# Patient Record
Sex: Female | Born: 1955
Health system: Southern US, Community
[De-identification: ages and names within clinical notes are randomized; demographics above are authoritative.]

---

## 1999-06-04 ENCOUNTER — Other Ambulatory Visit: Admission: RE | Admit: 1999-06-04 | Discharge: 1999-06-04 | Payer: Self-pay | Admitting: Obstetrics and Gynecology

## 2000-06-11 ENCOUNTER — Other Ambulatory Visit: Admission: RE | Admit: 2000-06-11 | Discharge: 2000-06-11 | Payer: Self-pay | Admitting: Obstetrics and Gynecology

## 2000-07-02 ENCOUNTER — Encounter: Admission: RE | Admit: 2000-07-02 | Discharge: 2000-07-02 | Payer: Self-pay | Admitting: Family Medicine

## 2000-07-02 ENCOUNTER — Encounter: Payer: Self-pay | Admitting: Family Medicine

## 2001-07-21 ENCOUNTER — Other Ambulatory Visit: Admission: RE | Admit: 2001-07-21 | Discharge: 2001-07-21 | Payer: Self-pay | Admitting: Obstetrics and Gynecology

## 2001-10-12 ENCOUNTER — Encounter: Admission: RE | Admit: 2001-10-12 | Discharge: 2002-01-10 | Payer: Self-pay | Admitting: Family Medicine

## 2002-08-28 ENCOUNTER — Encounter: Payer: Self-pay | Admitting: Family Medicine

## 2002-08-28 ENCOUNTER — Encounter: Admission: RE | Admit: 2002-08-28 | Discharge: 2002-08-28 | Payer: Self-pay | Admitting: Family Medicine

## 2003-11-28 ENCOUNTER — Other Ambulatory Visit: Admission: RE | Admit: 2003-11-28 | Discharge: 2003-11-28 | Payer: Self-pay | Admitting: Obstetrics and Gynecology

## 2004-01-07 ENCOUNTER — Other Ambulatory Visit: Admission: RE | Admit: 2004-01-07 | Discharge: 2004-01-07 | Payer: Self-pay | Admitting: Obstetrics and Gynecology

## 2004-01-09 ENCOUNTER — Encounter: Admission: RE | Admit: 2004-01-09 | Discharge: 2004-01-09 | Payer: Self-pay | Admitting: Family Medicine

## 2004-01-30 ENCOUNTER — Encounter: Admission: RE | Admit: 2004-01-30 | Discharge: 2004-01-30 | Payer: Self-pay | Admitting: Family Medicine

## 2004-04-16 ENCOUNTER — Encounter (INDEPENDENT_AMBULATORY_CARE_PROVIDER_SITE_OTHER): Payer: Self-pay | Admitting: *Deleted

## 2004-04-16 ENCOUNTER — Ambulatory Visit (HOSPITAL_COMMUNITY): Admission: RE | Admit: 2004-04-16 | Discharge: 2004-04-16 | Payer: Self-pay | Admitting: Endocrinology

## 2005-02-02 ENCOUNTER — Other Ambulatory Visit: Admission: RE | Admit: 2005-02-02 | Discharge: 2005-02-02 | Payer: Self-pay | Admitting: Obstetrics and Gynecology

## 2005-02-12 ENCOUNTER — Encounter: Admission: RE | Admit: 2005-02-12 | Discharge: 2005-02-12 | Payer: Self-pay | Admitting: Endocrinology

## 2006-04-15 ENCOUNTER — Other Ambulatory Visit: Admission: RE | Admit: 2006-04-15 | Discharge: 2006-04-15 | Payer: Self-pay | Admitting: Obstetrics and Gynecology

## 2006-08-12 ENCOUNTER — Encounter: Admission: RE | Admit: 2006-08-12 | Discharge: 2006-08-12 | Payer: Self-pay | Admitting: Family Medicine

## 2007-07-27 ENCOUNTER — Other Ambulatory Visit: Admission: RE | Admit: 2007-07-27 | Discharge: 2007-07-27 | Payer: Self-pay | Admitting: Obstetrics and Gynecology

## 2008-02-14 ENCOUNTER — Other Ambulatory Visit: Admission: RE | Admit: 2008-02-14 | Discharge: 2008-02-14 | Payer: Self-pay | Admitting: Obstetrics and Gynecology

## 2008-02-14 ENCOUNTER — Encounter: Payer: Self-pay | Admitting: Obstetrics and Gynecology

## 2008-02-14 ENCOUNTER — Ambulatory Visit: Payer: Self-pay | Admitting: Obstetrics and Gynecology

## 2008-06-04 ENCOUNTER — Encounter: Admission: RE | Admit: 2008-06-04 | Discharge: 2008-06-04 | Payer: Self-pay | Admitting: Internal Medicine

## 2008-06-26 ENCOUNTER — Encounter: Admission: RE | Admit: 2008-06-26 | Discharge: 2008-06-26 | Payer: Self-pay | Admitting: Internal Medicine

## 2008-06-26 ENCOUNTER — Encounter (INDEPENDENT_AMBULATORY_CARE_PROVIDER_SITE_OTHER): Payer: Self-pay | Admitting: Interventional Radiology

## 2008-06-26 ENCOUNTER — Other Ambulatory Visit: Admission: RE | Admit: 2008-06-26 | Discharge: 2008-06-26 | Payer: Self-pay | Admitting: Interventional Radiology

## 2008-07-09 ENCOUNTER — Encounter (HOSPITAL_COMMUNITY): Admission: RE | Admit: 2008-07-09 | Discharge: 2008-10-07 | Payer: Self-pay | Admitting: Internal Medicine

## 2008-11-27 ENCOUNTER — Ambulatory Visit: Payer: Self-pay | Admitting: Obstetrics and Gynecology

## 2008-11-27 ENCOUNTER — Other Ambulatory Visit: Admission: RE | Admit: 2008-11-27 | Discharge: 2008-11-27 | Payer: Self-pay | Admitting: Obstetrics and Gynecology

## 2008-11-27 ENCOUNTER — Encounter: Payer: Self-pay | Admitting: Obstetrics and Gynecology

## 2009-06-18 ENCOUNTER — Encounter: Admission: RE | Admit: 2009-06-18 | Discharge: 2009-06-18 | Payer: Self-pay | Admitting: Internal Medicine

## 2010-08-19 ENCOUNTER — Other Ambulatory Visit: Payer: Self-pay | Admitting: Internal Medicine

## 2010-08-19 DIAGNOSIS — E042 Nontoxic multinodular goiter: Secondary | ICD-10-CM

## 2010-08-22 ENCOUNTER — Ambulatory Visit
Admission: RE | Admit: 2010-08-22 | Discharge: 2010-08-22 | Disposition: A | Discharge status: Home / Self Care | Payer: 59 | Source: Ambulatory Visit | Attending: Internal Medicine | Admitting: Internal Medicine

## 2010-08-22 DIAGNOSIS — E042 Nontoxic multinodular goiter: Secondary | ICD-10-CM

## 2011-12-22 ENCOUNTER — Other Ambulatory Visit: Payer: Self-pay | Admitting: Internal Medicine

## 2011-12-22 DIAGNOSIS — E049 Nontoxic goiter, unspecified: Secondary | ICD-10-CM

## 2012-01-05 ENCOUNTER — Other Ambulatory Visit: Payer: 59

## 2012-01-07 ENCOUNTER — Ambulatory Visit
Admission: RE | Admit: 2012-01-07 | Discharge: 2012-01-07 | Disposition: A | Discharge status: Home / Self Care | Payer: Managed Care, Other (non HMO) | Source: Ambulatory Visit | Attending: Internal Medicine | Admitting: Internal Medicine

## 2012-01-07 DIAGNOSIS — E049 Nontoxic goiter, unspecified: Secondary | ICD-10-CM

## 2013-08-02 ENCOUNTER — Other Ambulatory Visit: Payer: Self-pay | Admitting: Internal Medicine

## 2013-08-02 DIAGNOSIS — E049 Nontoxic goiter, unspecified: Secondary | ICD-10-CM

## 2013-08-09 ENCOUNTER — Other Ambulatory Visit: Payer: Managed Care, Other (non HMO)

## 2013-08-28 ENCOUNTER — Ambulatory Visit
Admission: RE | Admit: 2013-08-28 | Discharge: 2013-08-28 | Disposition: A | Discharge status: Home / Self Care | Payer: Managed Care, Other (non HMO) | Source: Ambulatory Visit | Attending: Internal Medicine | Admitting: Internal Medicine

## 2013-08-28 DIAGNOSIS — E049 Nontoxic goiter, unspecified: Secondary | ICD-10-CM

## 2013-10-30 ENCOUNTER — Other Ambulatory Visit: Payer: Self-pay | Admitting: Family Medicine

## 2013-10-30 ENCOUNTER — Other Ambulatory Visit (HOSPITAL_COMMUNITY)
Admission: RE | Admit: 2013-10-30 | Discharge: 2013-10-30 | Disposition: A | Discharge status: Home / Self Care | Payer: Managed Care, Other (non HMO) | Source: Ambulatory Visit | Attending: Family Medicine | Admitting: Family Medicine

## 2013-10-30 DIAGNOSIS — Z0000 Encounter for general adult medical examination without abnormal findings: Secondary | ICD-10-CM | POA: Insufficient documentation

## 2013-10-31 LAB — CYTOLOGY - PAP

## 2014-05-29 ENCOUNTER — Other Ambulatory Visit: Payer: Self-pay | Admitting: Family Medicine

## 2014-05-29 DIAGNOSIS — E2839 Other primary ovarian failure: Secondary | ICD-10-CM

## 2014-10-22 IMAGING — US US SOFT TISSUE HEAD/NECK
1 series · 13 of 25 positions shown · non-contrast
Comparison: 01/07/2012 and 08/22/2010

CLINICAL DATA: Follow-up thyroid nodules.

EXAM:
THYROID ULTRASOUND
TECHNIQUE: Ultrasound examination of the thyroid gland and adjacent soft
tissues was performed.

[Series 1: us soft tissue head/neck · 0.10mm/px · 13 of 82 slices shown]
[im 1/82]
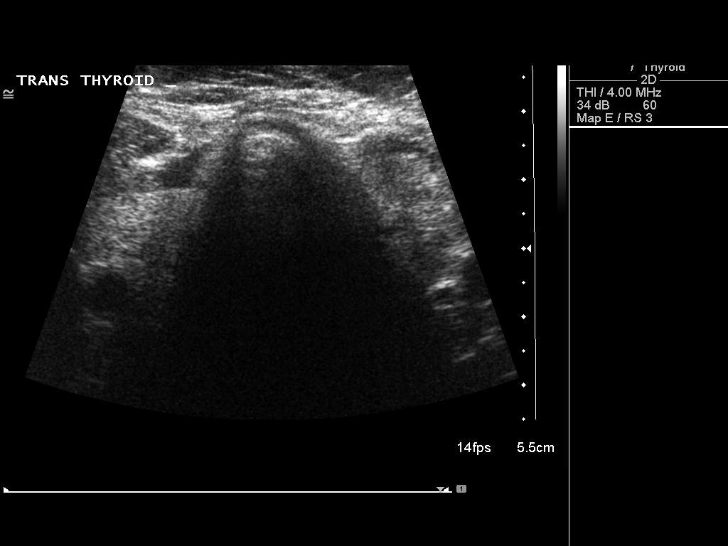
[im 7/82]
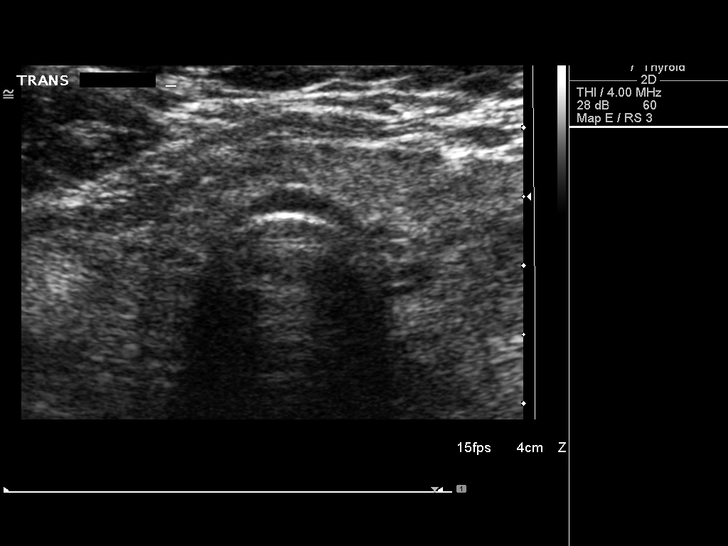
[im 14/82]
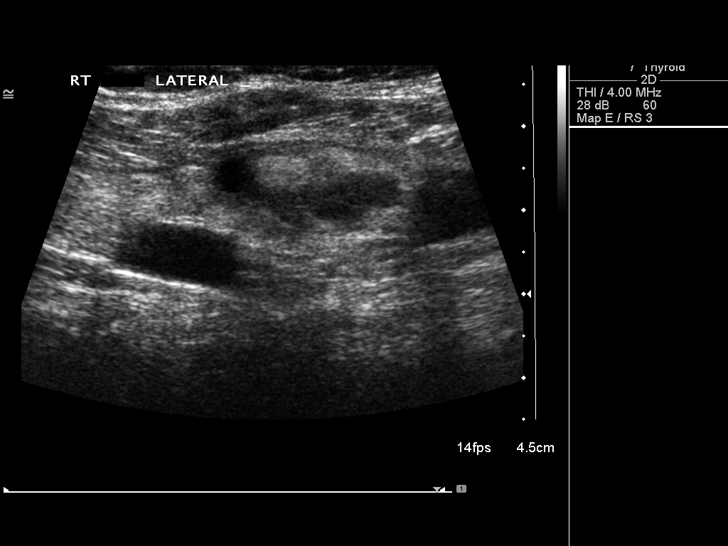
[im 21/82]
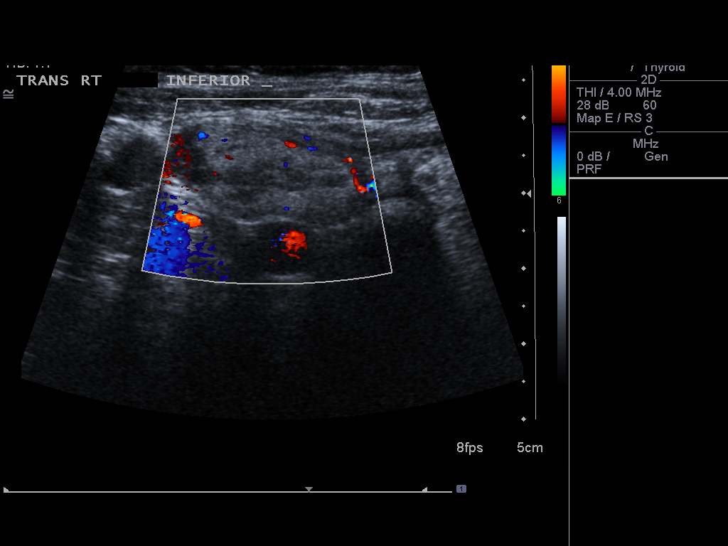
[im 28/82]
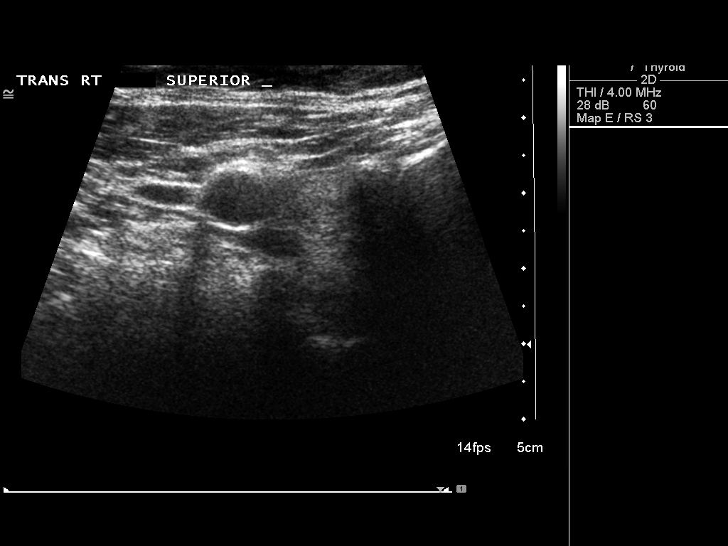
[im 34/82]
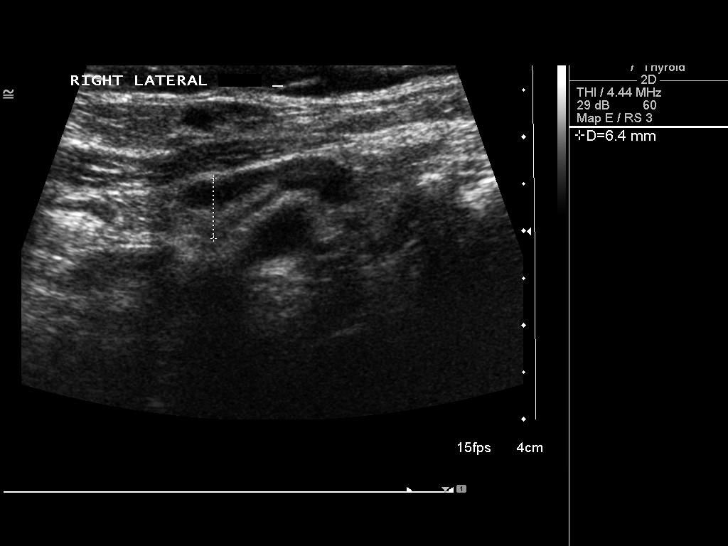
[im 41/82]
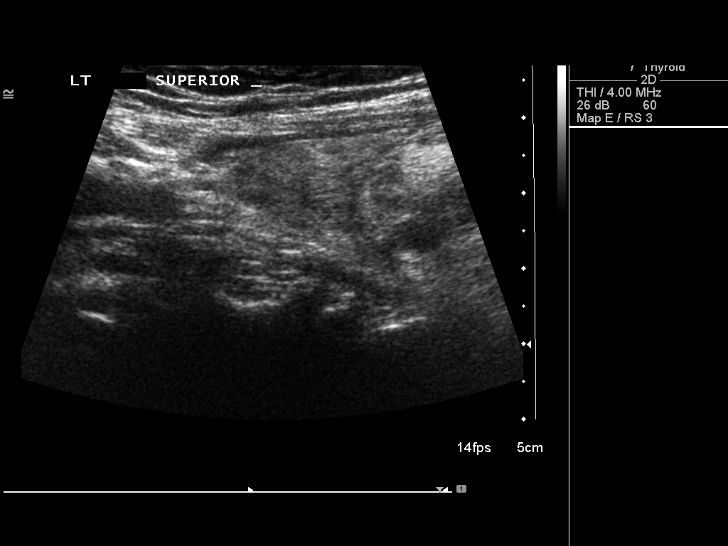
[im 48/82]
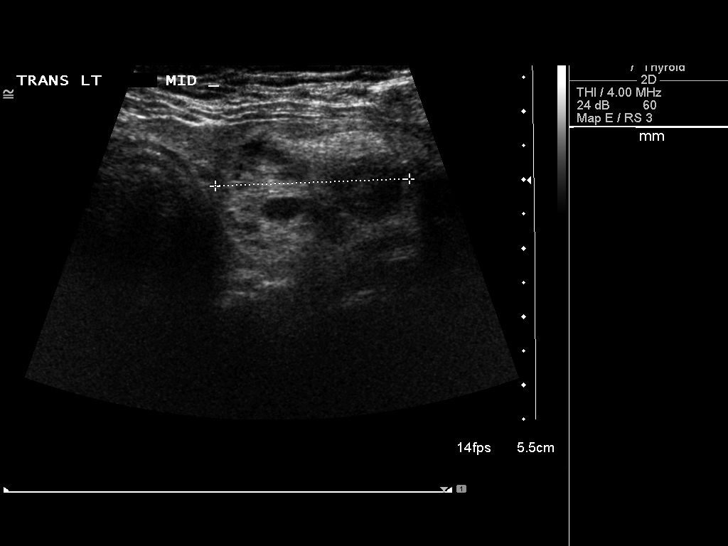
[im 55/82]
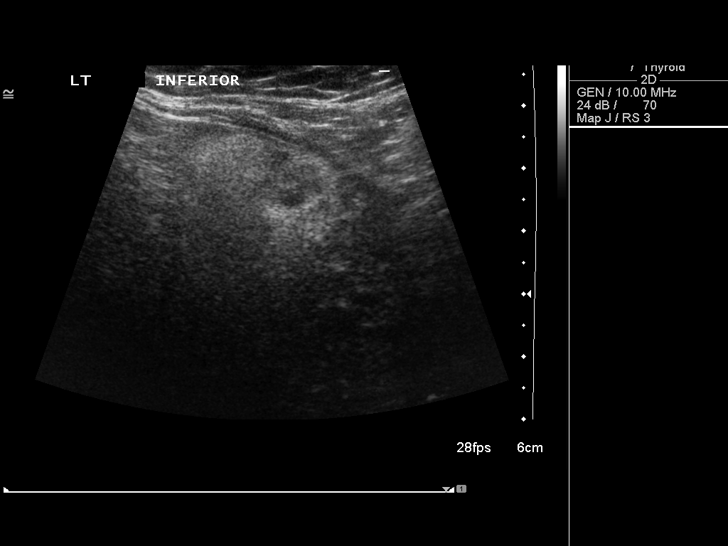
[im 61/82]
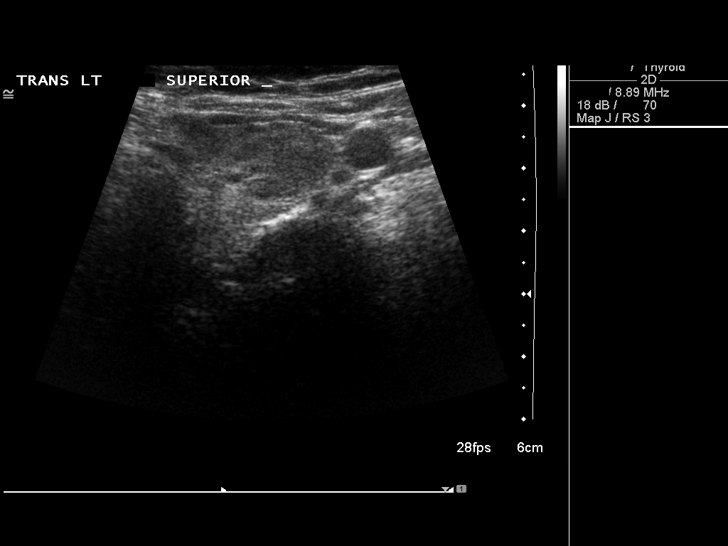
[im 68/82]
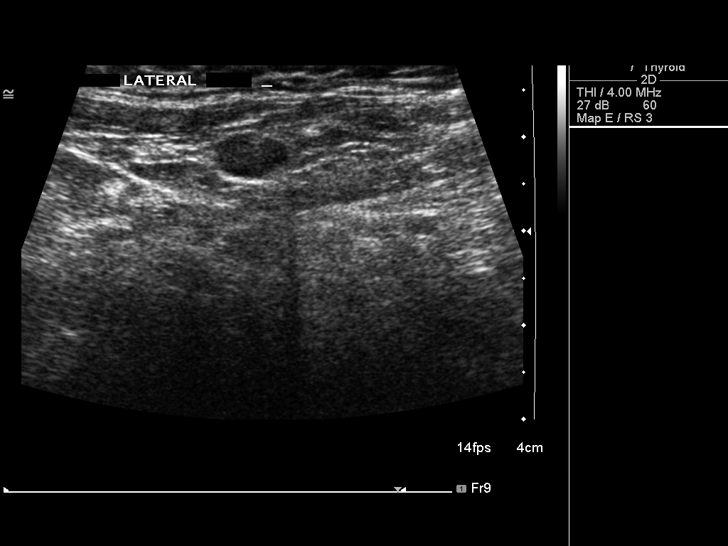
[im 75/82]
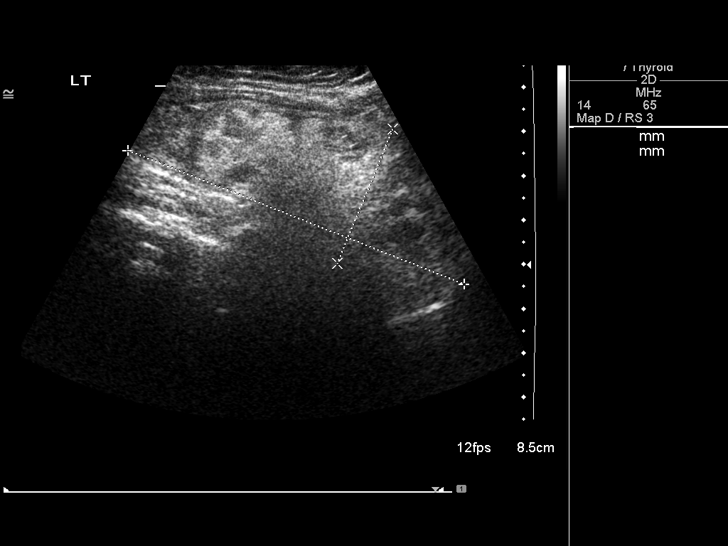
[im 82/82]
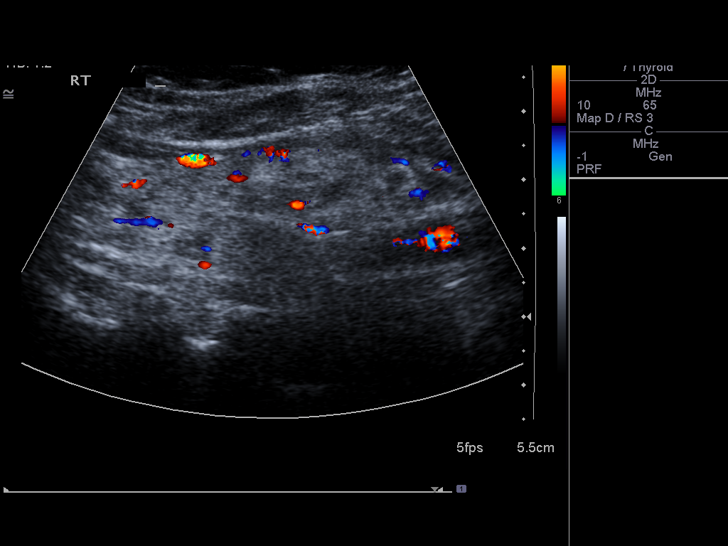

[13 of 25 positions shown; findings below may reference images not displayed]

FINDINGS: Right thyroid lobe

Measurements: 6.4 x 2.7 x 2.4 cm. There is a heterogeneous and
slightly hyperechoic nodule along the inferior right thyroid lobe.
This nodule measures 3.5 x 1.3 x 2.3 cm and previously measured
x 1.4 x 2.9 cm. Hypoechoic nodule along the posterior aspect of the
right thyroid lobe was not measured on the previous examinations but
was probably present on the exam from 08/22/2010. This area roughly
measures 2.1 x 1.1 x 1.3 cm. This lesion is not well defined and
could represent a pseudo-nodule.

Left thyroid lobe

Measurements: 8.2 x 3.3 x 3.2 cm. Heterogeneous nodule in the mid
left thyroid lobe measures 2.8 x 2.1 x 2.8 cm. Nodule previously
measured 3.1 x 2.2 x 2.8 cm. Heterogeneous solid nodule along the
inferior and lateral left lobe measures 1.7 x 1.9 x 1.6 cm. This was
not clearly identified on the prior examination. Dominant nodule in
the inferior left thyroid lobe measures 4.8 x 2.9 x 4.2 cm and
previously measured 4.2 x 3.9 x 4.5 cm.

Isthmus

Thickness: 0.3 cm.  No nodules visualized.

Lymphadenopathy

None visualized.
IMPRESSION: Multi-nodular goiter. The dominant nodules have not significantly
changed. Unclear if there is a new nodule along the lateral left
thyroid lobe which may be associated with the adjacent dominant
nodule. There is also a questionable nodule along the posterior
right thyroid lobe.

## 2016-02-28 ENCOUNTER — Other Ambulatory Visit: Payer: Self-pay | Admitting: Family Medicine

## 2016-02-28 DIAGNOSIS — R7989 Other specified abnormal findings of blood chemistry: Secondary | ICD-10-CM

## 2016-02-28 DIAGNOSIS — R945 Abnormal results of liver function studies: Principal | ICD-10-CM

## 2016-03-25 ENCOUNTER — Other Ambulatory Visit: Payer: Managed Care, Other (non HMO)

## 2017-10-01 ENCOUNTER — Other Ambulatory Visit: Payer: Self-pay | Admitting: Internal Medicine

## 2017-10-01 DIAGNOSIS — E042 Nontoxic multinodular goiter: Secondary | ICD-10-CM

## 2018-03-28 ENCOUNTER — Other Ambulatory Visit: Payer: Managed Care, Other (non HMO)

## 2018-12-09 ENCOUNTER — Other Ambulatory Visit: Payer: Self-pay | Admitting: Family Medicine

## 2018-12-09 ENCOUNTER — Other Ambulatory Visit (HOSPITAL_COMMUNITY)
Admission: RE | Admit: 2018-12-09 | Discharge: 2018-12-09 | Disposition: A | Discharge status: Home / Self Care | Payer: Managed Care, Other (non HMO) | Source: Ambulatory Visit | Attending: Family Medicine | Admitting: Family Medicine

## 2018-12-09 DIAGNOSIS — Z124 Encounter for screening for malignant neoplasm of cervix: Secondary | ICD-10-CM | POA: Insufficient documentation

## 2018-12-15 LAB — CYTOLOGY - PAP: Diagnosis: NEGATIVE

## 2019-03-13 ENCOUNTER — Other Ambulatory Visit: Payer: Self-pay | Admitting: Internal Medicine

## 2019-03-13 DIAGNOSIS — E042 Nontoxic multinodular goiter: Secondary | ICD-10-CM

## 2019-03-17 ENCOUNTER — Other Ambulatory Visit: Payer: Managed Care, Other (non HMO)

## 2019-05-17 ENCOUNTER — Other Ambulatory Visit: Payer: Managed Care, Other (non HMO)

## 2019-06-08 DIAGNOSIS — N182 Chronic kidney disease, stage 2 (mild): Secondary | ICD-10-CM | POA: Diagnosis not present

## 2019-06-08 DIAGNOSIS — E1122 Type 2 diabetes mellitus with diabetic chronic kidney disease: Secondary | ICD-10-CM | POA: Diagnosis not present

## 2019-06-08 DIAGNOSIS — E042 Nontoxic multinodular goiter: Secondary | ICD-10-CM | POA: Diagnosis not present

## 2019-06-08 DIAGNOSIS — E669 Obesity, unspecified: Secondary | ICD-10-CM | POA: Diagnosis not present

## 2019-06-14 ENCOUNTER — Other Ambulatory Visit: Payer: Self-pay

## 2019-06-19 ENCOUNTER — Ambulatory Visit
Admission: RE | Admit: 2019-06-19 | Discharge: 2019-06-19 | Disposition: A | Discharge status: Home / Self Care | Payer: BC Managed Care – PPO | Source: Ambulatory Visit | Attending: Internal Medicine | Admitting: Internal Medicine

## 2019-06-19 DIAGNOSIS — E042 Nontoxic multinodular goiter: Secondary | ICD-10-CM | POA: Diagnosis not present

## 2019-07-04 ENCOUNTER — Other Ambulatory Visit: Payer: Self-pay | Admitting: Internal Medicine

## 2019-07-04 DIAGNOSIS — E042 Nontoxic multinodular goiter: Secondary | ICD-10-CM

## 2019-07-18 ENCOUNTER — Ambulatory Visit
Admission: RE | Admit: 2019-07-18 | Discharge: 2019-07-18 | Disposition: A | Discharge status: Home / Self Care | Payer: BC Managed Care – PPO | Source: Ambulatory Visit | Attending: Internal Medicine | Admitting: Internal Medicine

## 2019-07-18 ENCOUNTER — Other Ambulatory Visit (HOSPITAL_COMMUNITY)
Admission: RE | Admit: 2019-07-18 | Discharge: 2019-07-18 | Disposition: A | Discharge status: Home / Self Care | Payer: BC Managed Care – PPO | Source: Ambulatory Visit | Attending: Internal Medicine | Admitting: Internal Medicine

## 2019-07-18 DIAGNOSIS — E041 Nontoxic single thyroid nodule: Secondary | ICD-10-CM | POA: Insufficient documentation

## 2019-07-18 DIAGNOSIS — E042 Nontoxic multinodular goiter: Secondary | ICD-10-CM | POA: Diagnosis not present

## 2019-07-20 LAB — CYTOLOGY - NON PAP

## 2019-09-08 DIAGNOSIS — E039 Hypothyroidism, unspecified: Secondary | ICD-10-CM | POA: Diagnosis not present

## 2019-09-08 DIAGNOSIS — E042 Nontoxic multinodular goiter: Secondary | ICD-10-CM | POA: Diagnosis not present

## 2019-09-08 DIAGNOSIS — Z0000 Encounter for general adult medical examination without abnormal findings: Secondary | ICD-10-CM | POA: Diagnosis not present

## 2019-09-08 DIAGNOSIS — E785 Hyperlipidemia, unspecified: Secondary | ICD-10-CM | POA: Diagnosis not present

## 2019-12-05 DIAGNOSIS — N182 Chronic kidney disease, stage 2 (mild): Secondary | ICD-10-CM | POA: Diagnosis not present

## 2019-12-05 DIAGNOSIS — E1122 Type 2 diabetes mellitus with diabetic chronic kidney disease: Secondary | ICD-10-CM | POA: Diagnosis not present

## 2019-12-05 DIAGNOSIS — E669 Obesity, unspecified: Secondary | ICD-10-CM | POA: Diagnosis not present

## 2019-12-05 DIAGNOSIS — E042 Nontoxic multinodular goiter: Secondary | ICD-10-CM | POA: Diagnosis not present

## 2019-12-11 DIAGNOSIS — E785 Hyperlipidemia, unspecified: Secondary | ICD-10-CM | POA: Diagnosis not present

## 2019-12-11 DIAGNOSIS — E1122 Type 2 diabetes mellitus with diabetic chronic kidney disease: Secondary | ICD-10-CM | POA: Diagnosis not present

## 2019-12-11 DIAGNOSIS — N182 Chronic kidney disease, stage 2 (mild): Secondary | ICD-10-CM | POA: Diagnosis not present

## 2019-12-11 DIAGNOSIS — E042 Nontoxic multinodular goiter: Secondary | ICD-10-CM | POA: Diagnosis not present

## 2020-02-15 DIAGNOSIS — Z1231 Encounter for screening mammogram for malignant neoplasm of breast: Secondary | ICD-10-CM | POA: Diagnosis not present

## 2020-02-15 DIAGNOSIS — Z13820 Encounter for screening for osteoporosis: Secondary | ICD-10-CM | POA: Diagnosis not present

## 2020-02-15 DIAGNOSIS — Z780 Asymptomatic menopausal state: Secondary | ICD-10-CM | POA: Diagnosis not present

## 2020-04-08 DIAGNOSIS — E1122 Type 2 diabetes mellitus with diabetic chronic kidney disease: Secondary | ICD-10-CM | POA: Diagnosis not present

## 2020-04-08 DIAGNOSIS — E669 Obesity, unspecified: Secondary | ICD-10-CM | POA: Diagnosis not present

## 2020-04-08 DIAGNOSIS — Z23 Encounter for immunization: Secondary | ICD-10-CM | POA: Diagnosis not present

## 2020-04-08 DIAGNOSIS — E042 Nontoxic multinodular goiter: Secondary | ICD-10-CM | POA: Diagnosis not present

## 2020-04-08 DIAGNOSIS — N182 Chronic kidney disease, stage 2 (mild): Secondary | ICD-10-CM | POA: Diagnosis not present

## 2020-07-08 DIAGNOSIS — E042 Nontoxic multinodular goiter: Secondary | ICD-10-CM | POA: Diagnosis not present

## 2020-07-08 DIAGNOSIS — E669 Obesity, unspecified: Secondary | ICD-10-CM | POA: Diagnosis not present

## 2020-07-08 DIAGNOSIS — N182 Chronic kidney disease, stage 2 (mild): Secondary | ICD-10-CM | POA: Diagnosis not present

## 2020-07-08 DIAGNOSIS — E1122 Type 2 diabetes mellitus with diabetic chronic kidney disease: Secondary | ICD-10-CM | POA: Diagnosis not present

## 2020-07-18 DIAGNOSIS — E119 Type 2 diabetes mellitus without complications: Secondary | ICD-10-CM | POA: Diagnosis not present

## 2020-08-12 IMAGING — US US THYROID
1 series · 12 of 25 positions shown · non-contrast
Comparison: 08/28/2013 and previous

CLINICAL DATA: Goiter . History of FNA biopsy right nodule
06/26/2008, right lower lobe nodule 04/16/2004.

EXAM:
THYROID ULTRASOUND
TECHNIQUE: Ultrasound examination of the thyroid gland and adjacent soft
tissues was performed.

[Series 1: us thyroid · 0.05mm/px · 12 of 43 slices shown]
[im 2/43]
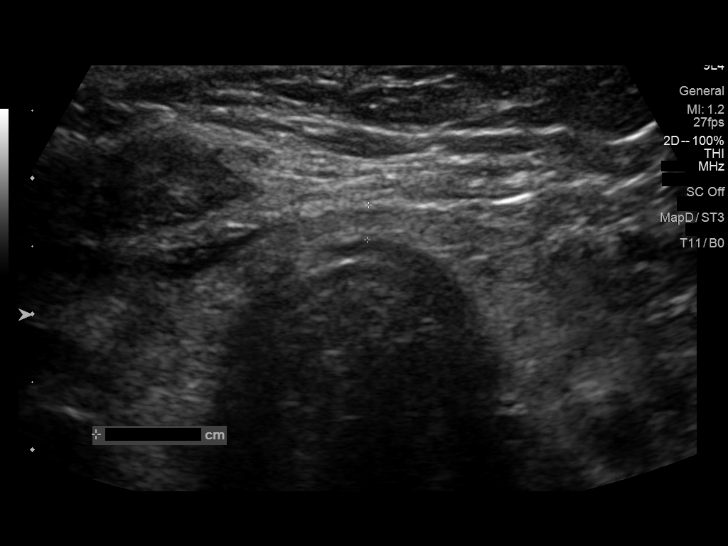
[im 6/43]
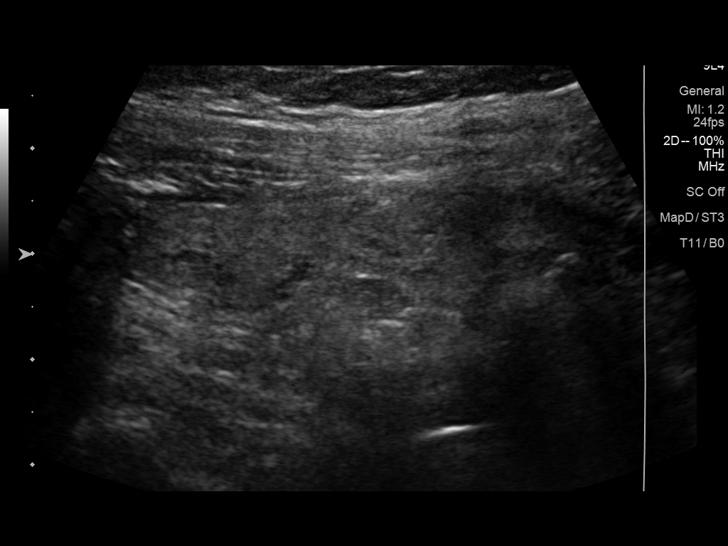
[im 9/43]
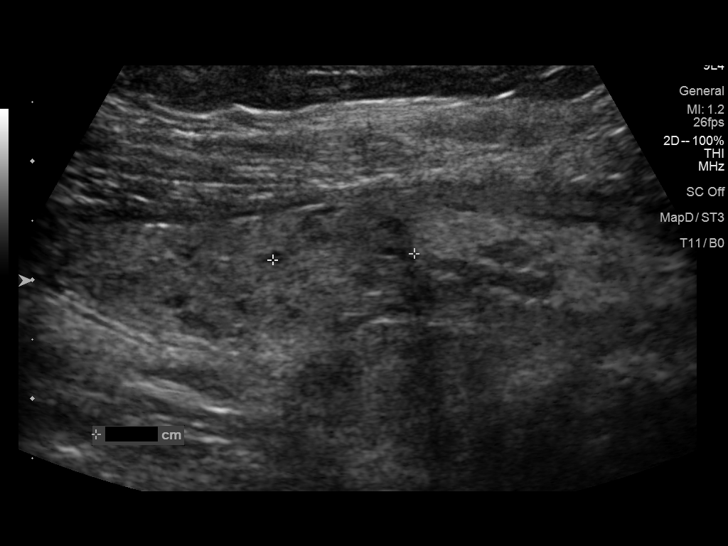
[im 13/43]
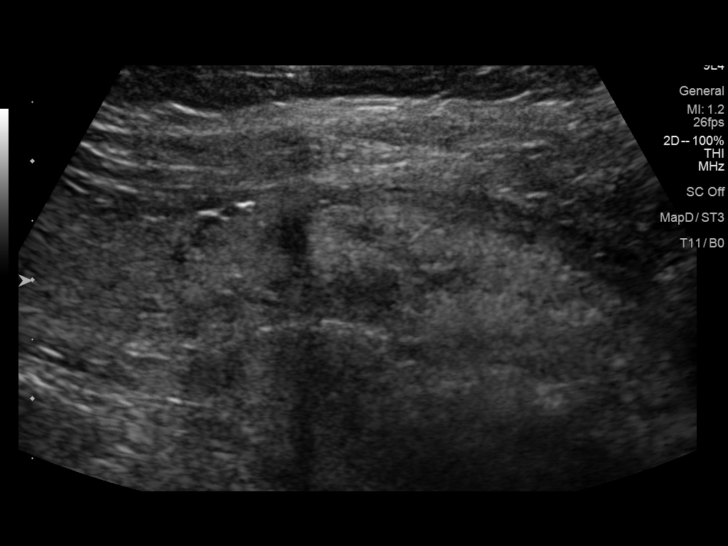
[im 16/43]
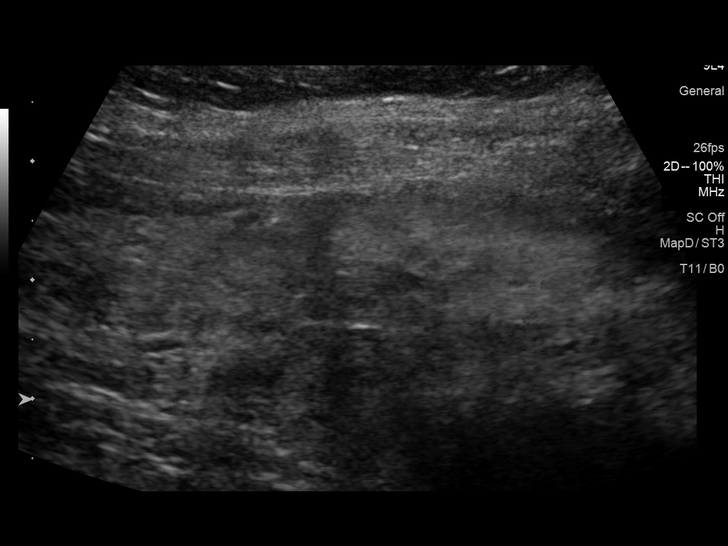
[im 20/43]
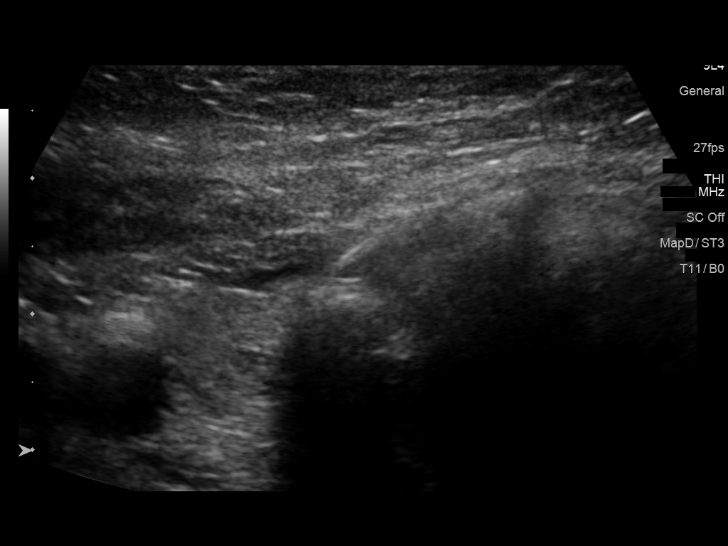
[im 23/43]
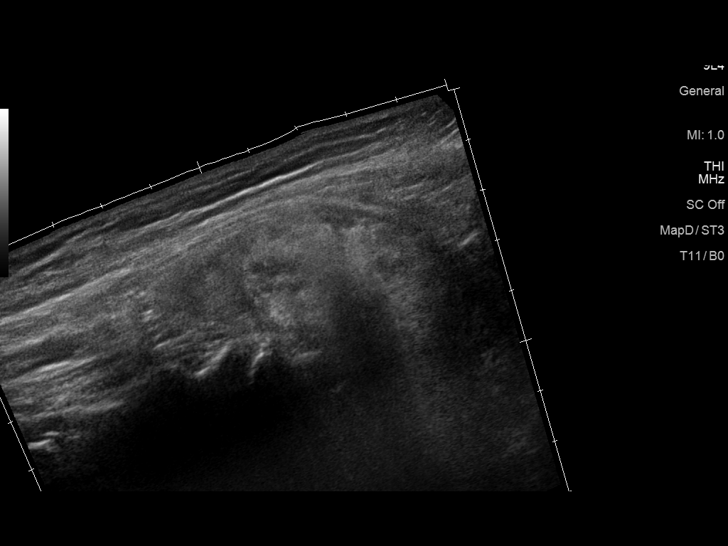
[im 27/43]
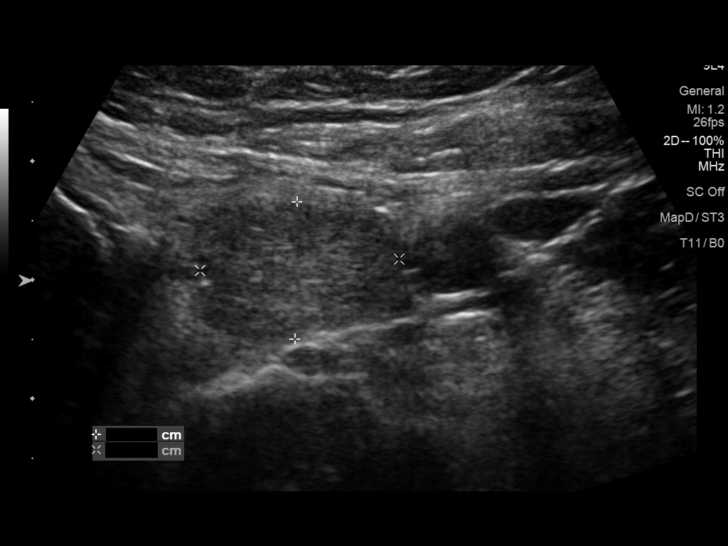
[im 30/43]
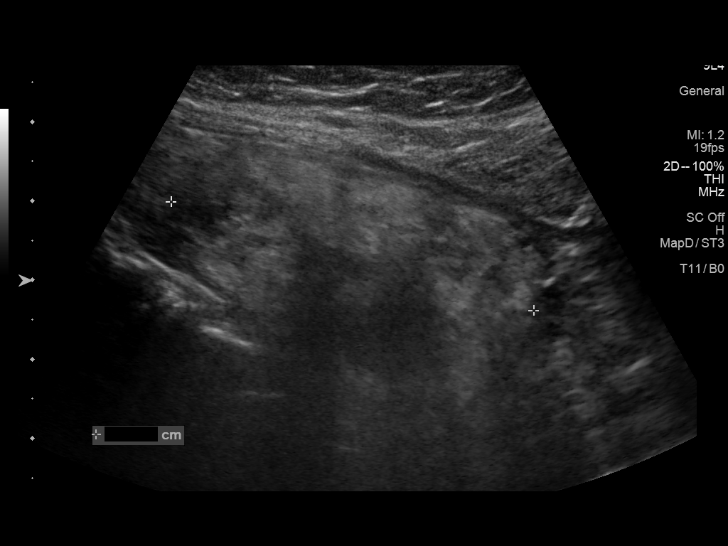
[im 34/43]
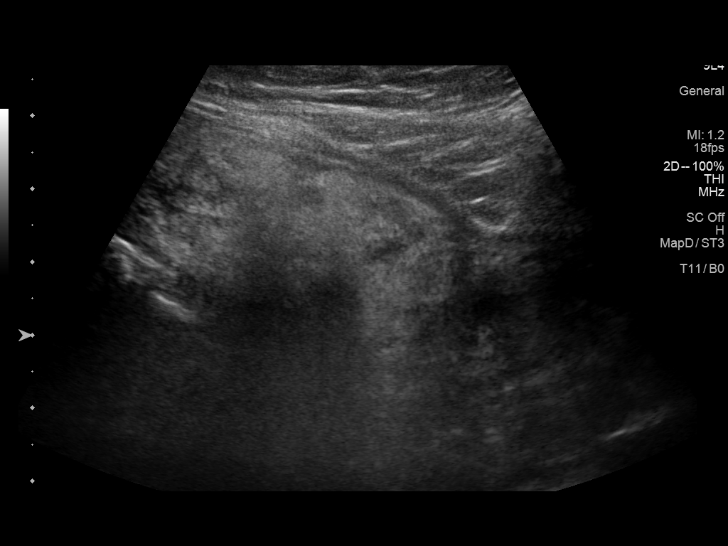
[im 37/43]
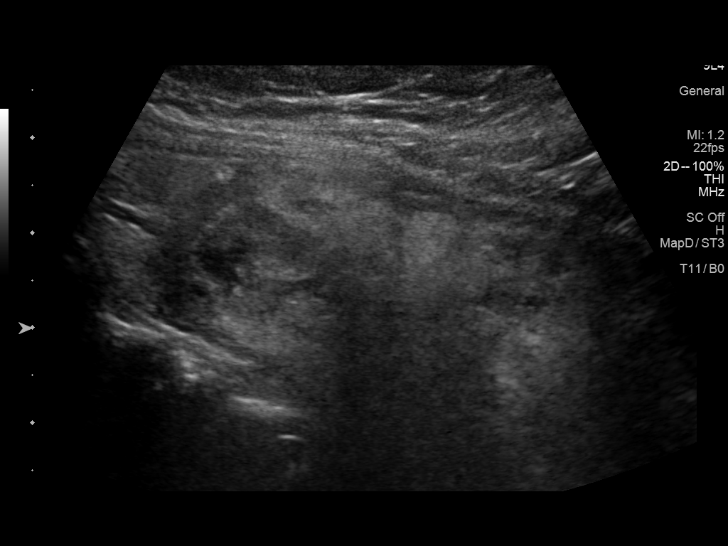
[im 41/43]
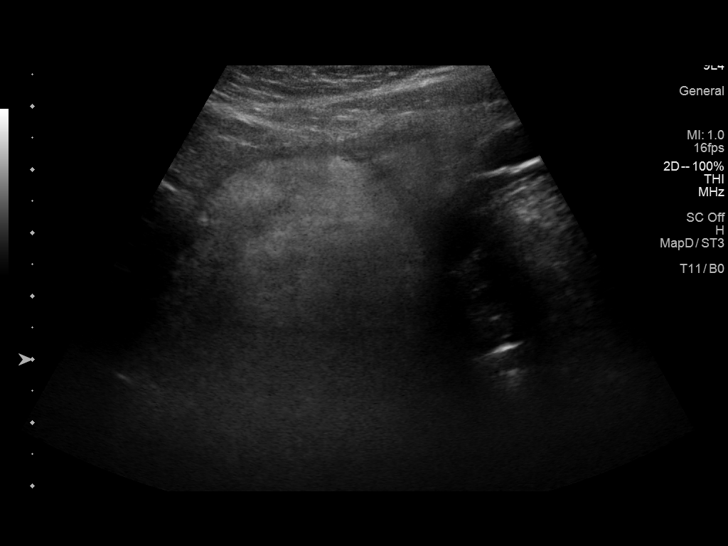

[12 of 25 positions shown; findings below may reference images not displayed]

FINDINGS: Parenchymal Echotexture: Moderately heterogenous

Isthmus: 0.3 cm thickness, stable

Right lobe: 5.6 x 2.8 x 2.1 cm, previously 6.4 x 2.7 x

Left lobe: 7.7 x 5.2 x 3.3 cm, previously 8.2 x 3.3 x

_________________________________________________________

Estimated total number of nodules >/= 1 cm: 5

Number of spongiform nodules >/=  2 cm not described below (TR1): 0

Number of mixed cystic and solid nodules >/= 1.5 cm not described
below (TR2): 0

_________________________________________________________

Nodule # 1:

Location: Right; Superior

Maximum size: 1.2 cm; Other 2 dimensions: 1.1 x 1 cm

Composition: solid/almost completely solid (2)

Echogenicity: isoechoic (1)

Shape: not taller-than-wide (0)

Margins: ill-defined (0)

Echogenic foci: none (0)

ACR TI-RADS total points: 3.

ACR TI-RADS risk category: TR3 (3 points).

ACR TI-RADS recommendations:

Given size (<1.4 cm) and appearance, this nodule does NOT meet
TI-RADS criteria for biopsy or dedicated follow-up.

_________________________________________________________

Nodule # 2: 3.4 x 2.3 x 1.1 cm inferior right nodule, previously
x 1.3 x 1.1; this was previously biopsied x2

Nodule # 3:

Location: Left; Superior

Maximum size: 1.7 cm; Other 2 dimensions: 1.3 x 1.2 cm

Composition: solid/almost completely solid (2)

Echogenicity: hypoechoic (2)

Shape: not taller-than-wide (0)

Margins: ill-defined (0)

Echogenic foci: none (0)

ACR TI-RADS total points: 4.

ACR TI-RADS risk category: TR4 (4-6 points).

ACR TI-RADS recommendations:

**Given size (>/= 1.5 cm) and appearance, fine needle aspiration of
this moderately suspicious nodule should be considered based on
TI-RADS criteria.

_________________________________________________________

Nodule # 4:

Prior biopsy: No

Location: Left; Mid

Maximum size: 2.6 cm; Other 2 dimensions: 2.4 x 2.2 cm, previously,
2.8 x 2.8 x 2.1 cm

Composition: solid/almost completely solid (2)

Echogenicity: hyperechoic (1)

Shape: not taller-than-wide (0)

Margins: ill-defined (0)

Echogenic foci: none (0)

ACR TI-RADS total points: 3.

ACR TI-RADS risk category:  TR3 (3 points).

Significant change in size (>/= 20% in two dimensions and minimal
increase of 2 mm): No

Change in features: No

Change in ACR TI-RADS risk category: No

ACR TI-RADS recommendations:

Stability for greater than 5 years implies benignity; This nodule
does NOT meet TI-RADS criteria for biopsy or dedicated follow-up.

Nodule # 5:

Prior biopsy: No

Location: Left; Inferior

Maximum size: 4.8 cm; Other 2 dimensions: 4.5 x 3.2 cm, previously,
4.7 x 5.1 x 3 cm on 01/07/2012

Composition: solid/almost completely solid (2)

Echogenicity: hyperechoic (1)

Shape: taller-than-wide (3)

Margins: ill-defined (0)

Echogenic foci: none (0)

ACR TI-RADS total points: 6.

ACR TI-RADS risk category:  TR4 (4-6 points).

Significant change in size (>/= 20% in two dimensions and minimal
increase of 2 mm): No

Change in features: No

Change in ACR TI-RADS risk category: No

ACR TI-RADS recommendations:

Stability for greater than 5 years implies benignity; This nodule
does NOT meet TI-RADS criteria for biopsy or dedicated follow-up.

_________________________________________________________
IMPRESSION: 1. Thyromegaly with bilateral nodules. Recommend FNA biopsy of
moderately suspicious 1.7 cm superior left nodule.

The above is in keeping with the ACR TI-RADS recommendations - [HOSPITAL] 2843;[DATE].

## 2020-09-09 DIAGNOSIS — L409 Psoriasis, unspecified: Secondary | ICD-10-CM | POA: Diagnosis not present

## 2020-09-09 DIAGNOSIS — Z0000 Encounter for general adult medical examination without abnormal findings: Secondary | ICD-10-CM | POA: Diagnosis not present

## 2020-09-09 DIAGNOSIS — Z23 Encounter for immunization: Secondary | ICD-10-CM | POA: Diagnosis not present

## 2020-09-09 DIAGNOSIS — E1122 Type 2 diabetes mellitus with diabetic chronic kidney disease: Secondary | ICD-10-CM | POA: Diagnosis not present

## 2020-09-09 DIAGNOSIS — I129 Hypertensive chronic kidney disease with stage 1 through stage 4 chronic kidney disease, or unspecified chronic kidney disease: Secondary | ICD-10-CM | POA: Diagnosis not present

## 2020-09-09 DIAGNOSIS — E039 Hypothyroidism, unspecified: Secondary | ICD-10-CM | POA: Diagnosis not present

## 2020-09-10 IMAGING — US US FNA BIOPSY THYROID 1ST LESION
1 series · 13 of 18 positions shown · non-contrast
Comparison: Thyroid ultrasound dated 06/19/2019

MEDICATIONS:
None

COMPLICATIONS:
None immediate.

INDICATION: Patient with history of multinodular goiter and prior benign
biopsies of both right and left thyroid nodules in 4443 and [id number].
Follow-up thyroid ultrasound on 06/19/2019 revealed thyromegaly with
bilateral nodules and a 1.7 cm left superior nodule which meets
criteria for biopsy. She presents today for the procedure.

EXAM:
ULTRASOUND GUIDED FINE NEEDLE ASPIRATION BIOPSY OF LEFT SUPERIOR
THYROID NODULE
TECHNIQUE: Informed written consent was obtained from the patient after a
discussion of the risks, benefits and alternatives to treatment.
Questions regarding the procedure were encouraged and answered. A
timeout was performed prior to the initiation of the procedure.

[Series 1: us fna biopsy thyroid 1st lesion · 0.06mm/px · 18 acquisitions, 13 frames shown]
[im 1/18]
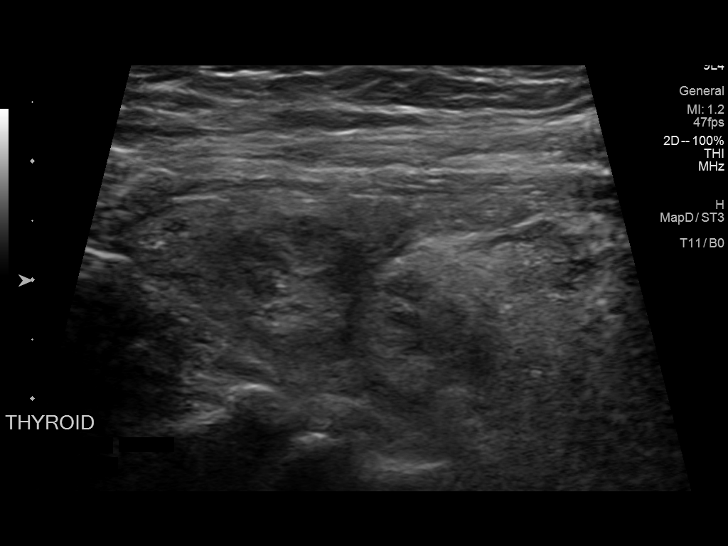
[im 3/18]
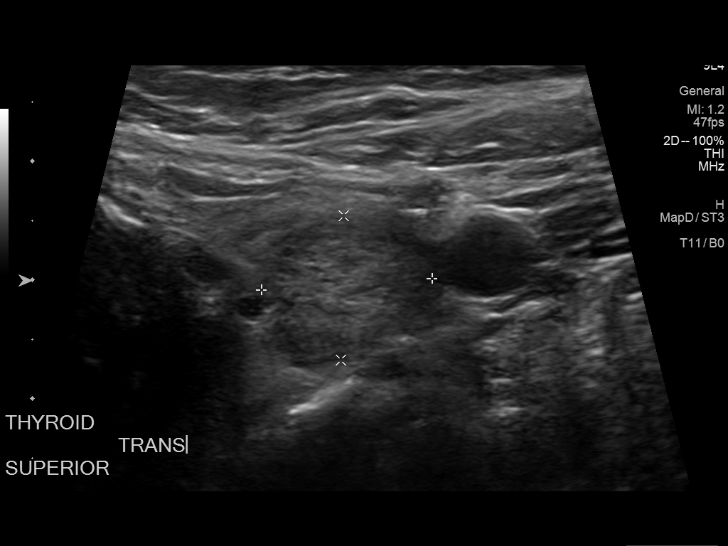
[im 4/18]
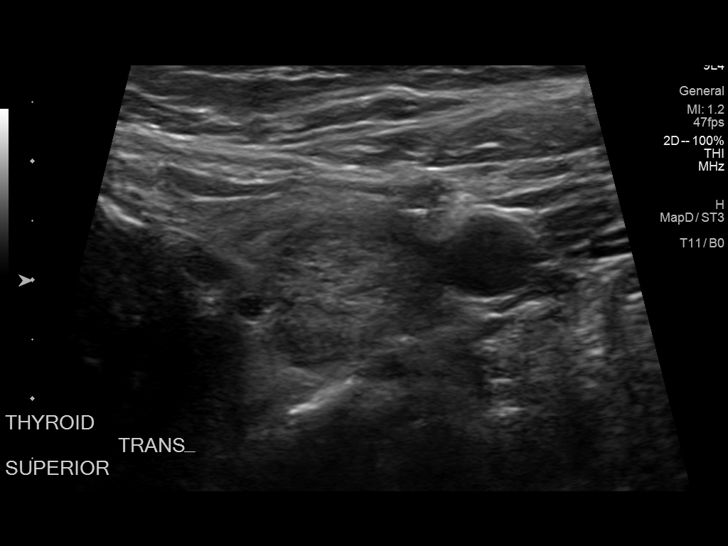
[im 5/18]
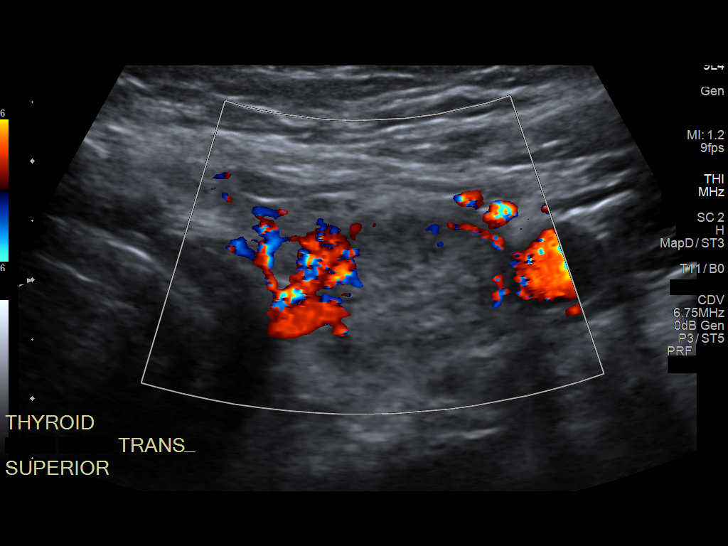
[im 7/18]
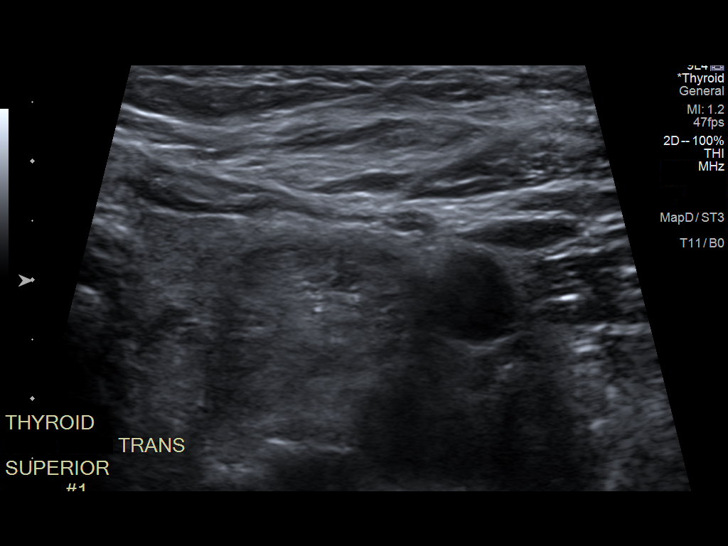
[im 8/18]
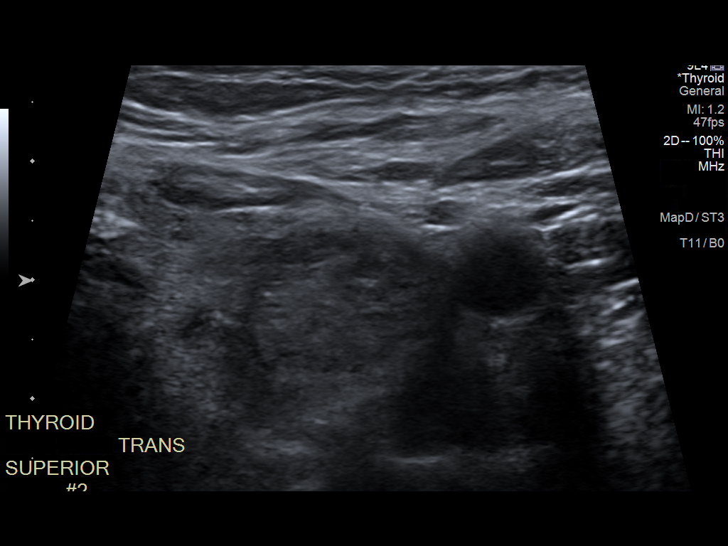
[im 10/18]
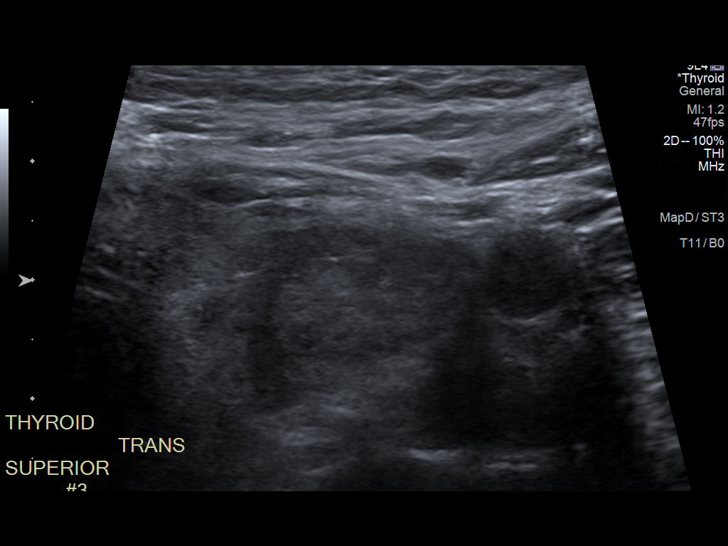
[im 11/18]
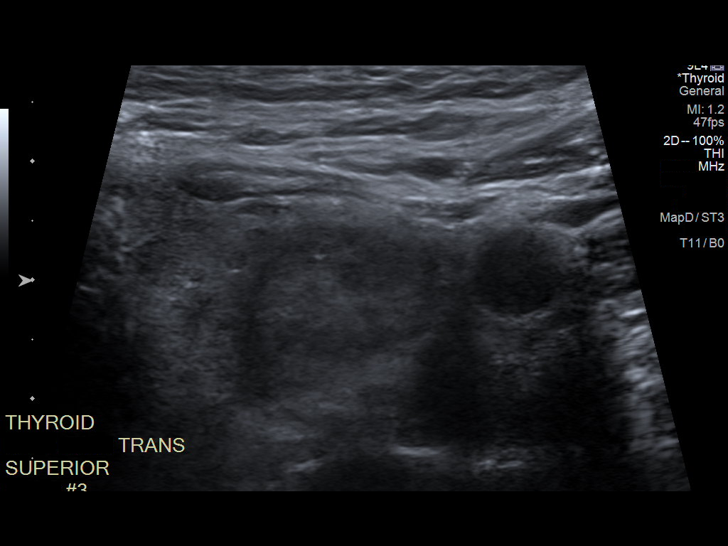
[im 12/18]
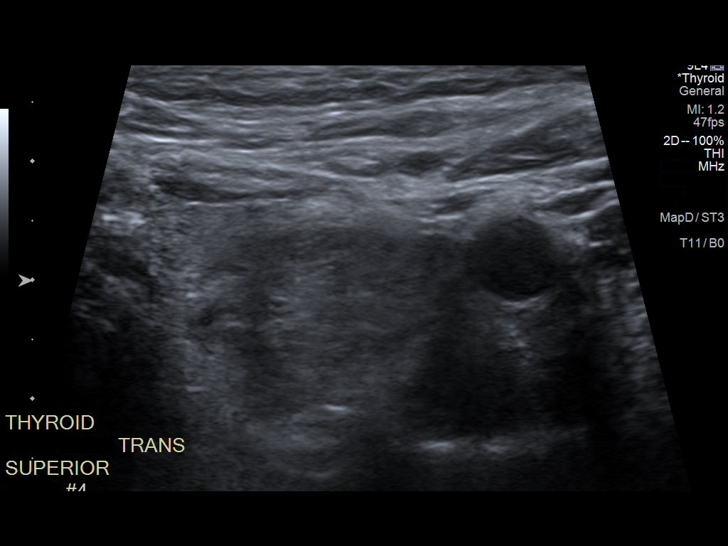
[im 14/18]
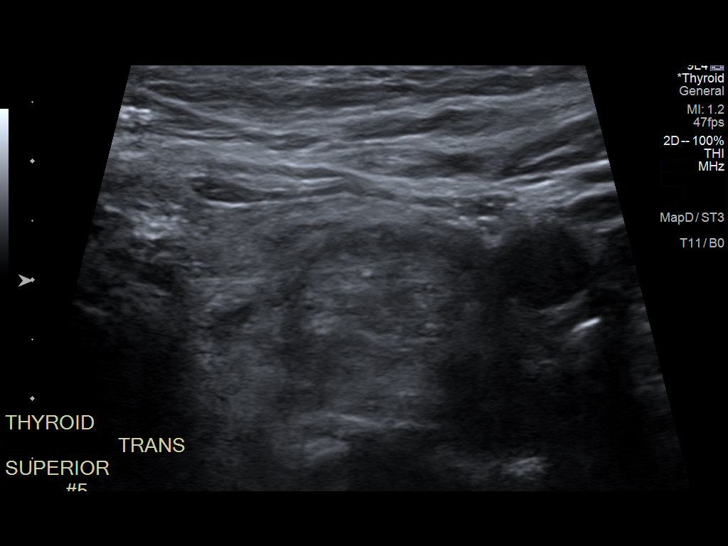
[im 15/18]
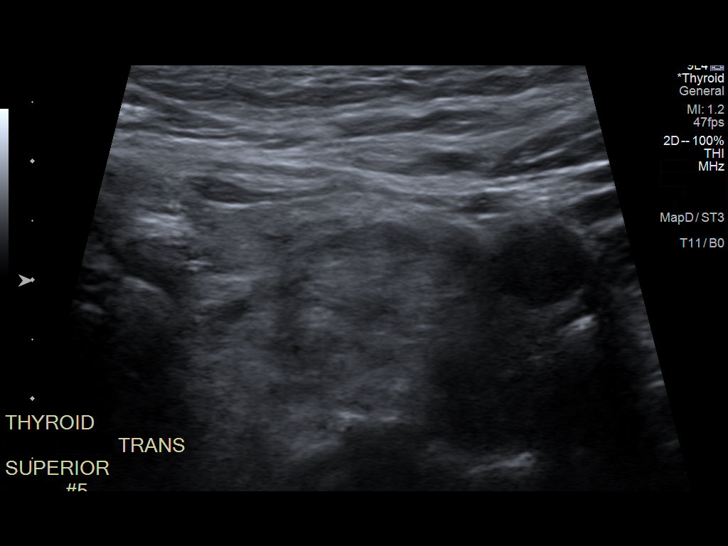
[im 16/18]
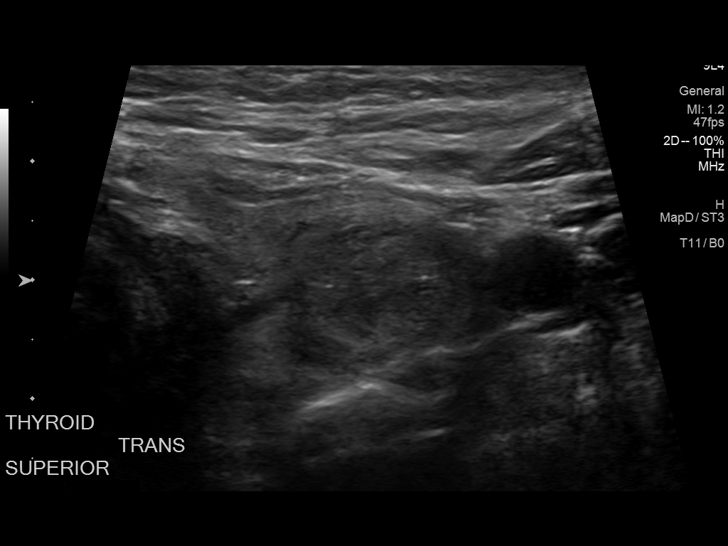
[im 18/18]
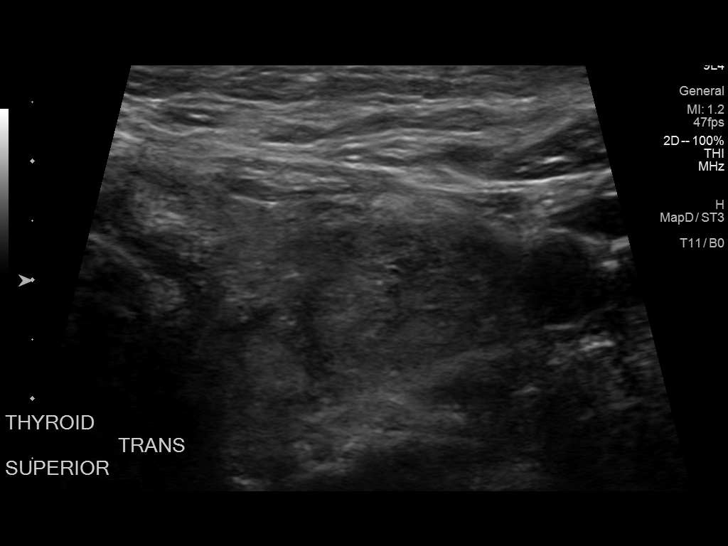

[13 of 18 positions shown; findings below may reference images not displayed]

Pre-procedural ultrasound scanning demonstrated unchanged size and
appearance of the indeterminate nodule within the left superior
thyroid lobe

The procedure was planned. The neck was prepped in the usual sterile
fashion, and a sterile drape was applied covering the operative
field. A timeout was performed prior to the initiation of the
procedure. Local anesthesia was provided with 1% lidocaine.

Under direct ultrasound guidance, 5 FNA biopsies were performed of
the left superior thyroid nodule with 25 gauge needles. Multiple
ultrasound images were saved for procedural documentation purposes.
The samples were prepared and submitted to pathology as well as for
Afirma testing.

Limited post procedural scanning was negative for hematoma or
additional complication. Dressings were placed. The patient
tolerated the above procedures procedure well without immediate
postprocedural complication.
FINDINGS: Nodule reference number based on prior diagnostic ultrasound: 3

Maximum size: 1.7 cm

Location: Left; Superior

ACR TI-RADS risk category: TR4 (4-6 points)

Reason for biopsy: meets ACR TI-RADS criteria

Ultrasound imaging confirms appropriate placement of the needles
within the thyroid nodule.
IMPRESSION: Technically successful ultrasound guided fine needle aspiration
biopsy of left superior thyroid nodule. Final pathology pending.

## 2020-09-16 DIAGNOSIS — H40023 Open angle with borderline findings, high risk, bilateral: Secondary | ICD-10-CM | POA: Diagnosis not present

## 2020-09-20 DIAGNOSIS — L400 Psoriasis vulgaris: Secondary | ICD-10-CM | POA: Diagnosis not present

## 2020-10-25 DIAGNOSIS — H40053 Ocular hypertension, bilateral: Secondary | ICD-10-CM | POA: Diagnosis not present

## 2020-11-08 DIAGNOSIS — H401131 Primary open-angle glaucoma, bilateral, mild stage: Secondary | ICD-10-CM | POA: Diagnosis not present

## 2020-12-09 DIAGNOSIS — E039 Hypothyroidism, unspecified: Secondary | ICD-10-CM | POA: Diagnosis not present

## 2021-01-06 DIAGNOSIS — E042 Nontoxic multinodular goiter: Secondary | ICD-10-CM | POA: Diagnosis not present

## 2021-01-06 DIAGNOSIS — E669 Obesity, unspecified: Secondary | ICD-10-CM | POA: Diagnosis not present

## 2021-01-06 DIAGNOSIS — Z23 Encounter for immunization: Secondary | ICD-10-CM | POA: Diagnosis not present

## 2021-01-06 DIAGNOSIS — E1122 Type 2 diabetes mellitus with diabetic chronic kidney disease: Secondary | ICD-10-CM | POA: Diagnosis not present

## 2021-01-06 DIAGNOSIS — N182 Chronic kidney disease, stage 2 (mild): Secondary | ICD-10-CM | POA: Diagnosis not present

## 2021-01-21 DIAGNOSIS — H401132 Primary open-angle glaucoma, bilateral, moderate stage: Secondary | ICD-10-CM | POA: Diagnosis not present

## 2021-01-21 DIAGNOSIS — H43812 Vitreous degeneration, left eye: Secondary | ICD-10-CM | POA: Diagnosis not present

## 2021-01-21 DIAGNOSIS — H25813 Combined forms of age-related cataract, bilateral: Secondary | ICD-10-CM | POA: Diagnosis not present

## 2021-01-21 DIAGNOSIS — E119 Type 2 diabetes mellitus without complications: Secondary | ICD-10-CM | POA: Diagnosis not present

## 2021-02-03 DIAGNOSIS — L400 Psoriasis vulgaris: Secondary | ICD-10-CM | POA: Diagnosis not present

## 2021-02-05 DIAGNOSIS — H401112 Primary open-angle glaucoma, right eye, moderate stage: Secondary | ICD-10-CM | POA: Diagnosis not present

## 2021-02-06 DIAGNOSIS — E039 Hypothyroidism, unspecified: Secondary | ICD-10-CM | POA: Diagnosis not present

## 2021-02-06 DIAGNOSIS — E1122 Type 2 diabetes mellitus with diabetic chronic kidney disease: Secondary | ICD-10-CM | POA: Diagnosis not present

## 2021-02-11 DIAGNOSIS — Z86010 Personal history of colonic polyps: Secondary | ICD-10-CM | POA: Diagnosis not present

## 2021-02-11 DIAGNOSIS — K648 Other hemorrhoids: Secondary | ICD-10-CM | POA: Diagnosis not present

## 2021-02-11 DIAGNOSIS — Z800 Family history of malignant neoplasm of digestive organs: Secondary | ICD-10-CM | POA: Diagnosis not present

## 2021-02-11 DIAGNOSIS — K5730 Diverticulosis of large intestine without perforation or abscess without bleeding: Secondary | ICD-10-CM | POA: Diagnosis not present

## 2021-02-11 DIAGNOSIS — D123 Benign neoplasm of transverse colon: Secondary | ICD-10-CM | POA: Diagnosis not present

## 2021-02-11 DIAGNOSIS — D124 Benign neoplasm of descending colon: Secondary | ICD-10-CM | POA: Diagnosis not present

## 2021-02-19 DIAGNOSIS — H401122 Primary open-angle glaucoma, left eye, moderate stage: Secondary | ICD-10-CM | POA: Diagnosis not present

## 2021-04-21 DIAGNOSIS — H25813 Combined forms of age-related cataract, bilateral: Secondary | ICD-10-CM | POA: Diagnosis not present

## 2021-04-21 DIAGNOSIS — E119 Type 2 diabetes mellitus without complications: Secondary | ICD-10-CM | POA: Diagnosis not present

## 2021-04-21 DIAGNOSIS — H401132 Primary open-angle glaucoma, bilateral, moderate stage: Secondary | ICD-10-CM | POA: Diagnosis not present

## 2021-04-21 DIAGNOSIS — H43812 Vitreous degeneration, left eye: Secondary | ICD-10-CM | POA: Diagnosis not present

## 2021-06-02 DIAGNOSIS — H43812 Vitreous degeneration, left eye: Secondary | ICD-10-CM | POA: Diagnosis not present

## 2021-06-02 DIAGNOSIS — H401132 Primary open-angle glaucoma, bilateral, moderate stage: Secondary | ICD-10-CM | POA: Diagnosis not present

## 2021-06-02 DIAGNOSIS — H25813 Combined forms of age-related cataract, bilateral: Secondary | ICD-10-CM | POA: Diagnosis not present

## 2021-06-02 DIAGNOSIS — E119 Type 2 diabetes mellitus without complications: Secondary | ICD-10-CM | POA: Diagnosis not present

## 2021-09-16 DIAGNOSIS — J019 Acute sinusitis, unspecified: Secondary | ICD-10-CM | POA: Diagnosis not present

## 2021-09-16 DIAGNOSIS — J029 Acute pharyngitis, unspecified: Secondary | ICD-10-CM | POA: Diagnosis not present

## 2021-09-16 DIAGNOSIS — R0981 Nasal congestion: Secondary | ICD-10-CM | POA: Diagnosis not present

## 2021-09-16 DIAGNOSIS — R059 Cough, unspecified: Secondary | ICD-10-CM | POA: Diagnosis not present

## 2021-10-20 DIAGNOSIS — H25813 Combined forms of age-related cataract, bilateral: Secondary | ICD-10-CM | POA: Diagnosis not present

## 2021-10-20 DIAGNOSIS — E119 Type 2 diabetes mellitus without complications: Secondary | ICD-10-CM | POA: Diagnosis not present

## 2021-10-20 DIAGNOSIS — H401132 Primary open-angle glaucoma, bilateral, moderate stage: Secondary | ICD-10-CM | POA: Diagnosis not present

## 2021-10-20 DIAGNOSIS — H43812 Vitreous degeneration, left eye: Secondary | ICD-10-CM | POA: Diagnosis not present

## 2021-11-17 DIAGNOSIS — Z0000 Encounter for general adult medical examination without abnormal findings: Secondary | ICD-10-CM | POA: Diagnosis not present

## 2021-11-17 DIAGNOSIS — E785 Hyperlipidemia, unspecified: Secondary | ICD-10-CM | POA: Diagnosis not present

## 2021-11-17 DIAGNOSIS — Z23 Encounter for immunization: Secondary | ICD-10-CM | POA: Diagnosis not present

## 2021-11-17 DIAGNOSIS — E1122 Type 2 diabetes mellitus with diabetic chronic kidney disease: Secondary | ICD-10-CM | POA: Diagnosis not present

## 2021-11-17 DIAGNOSIS — E1165 Type 2 diabetes mellitus with hyperglycemia: Secondary | ICD-10-CM | POA: Diagnosis not present

## 2021-11-17 DIAGNOSIS — E039 Hypothyroidism, unspecified: Secondary | ICD-10-CM | POA: Diagnosis not present

## 2021-11-17 DIAGNOSIS — K760 Fatty (change of) liver, not elsewhere classified: Secondary | ICD-10-CM | POA: Diagnosis not present

## 2021-12-01 DIAGNOSIS — H43812 Vitreous degeneration, left eye: Secondary | ICD-10-CM | POA: Diagnosis not present

## 2021-12-01 DIAGNOSIS — H401132 Primary open-angle glaucoma, bilateral, moderate stage: Secondary | ICD-10-CM | POA: Diagnosis not present

## 2021-12-01 DIAGNOSIS — E119 Type 2 diabetes mellitus without complications: Secondary | ICD-10-CM | POA: Diagnosis not present

## 2021-12-01 DIAGNOSIS — H25813 Combined forms of age-related cataract, bilateral: Secondary | ICD-10-CM | POA: Diagnosis not present

## 2022-04-20 DIAGNOSIS — H401132 Primary open-angle glaucoma, bilateral, moderate stage: Secondary | ICD-10-CM | POA: Diagnosis not present

## 2022-04-20 DIAGNOSIS — H43812 Vitreous degeneration, left eye: Secondary | ICD-10-CM | POA: Diagnosis not present

## 2022-04-20 DIAGNOSIS — E119 Type 2 diabetes mellitus without complications: Secondary | ICD-10-CM | POA: Diagnosis not present

## 2022-04-20 DIAGNOSIS — H25812 Combined forms of age-related cataract, left eye: Secondary | ICD-10-CM | POA: Diagnosis not present

## 2022-05-20 DIAGNOSIS — E781 Pure hyperglyceridemia: Secondary | ICD-10-CM | POA: Diagnosis not present

## 2022-05-20 DIAGNOSIS — E785 Hyperlipidemia, unspecified: Secondary | ICD-10-CM | POA: Diagnosis not present

## 2022-05-20 DIAGNOSIS — E1122 Type 2 diabetes mellitus with diabetic chronic kidney disease: Secondary | ICD-10-CM | POA: Diagnosis not present

## 2022-05-20 DIAGNOSIS — Z23 Encounter for immunization: Secondary | ICD-10-CM | POA: Diagnosis not present

## 2022-05-20 DIAGNOSIS — K760 Fatty (change of) liver, not elsewhere classified: Secondary | ICD-10-CM | POA: Diagnosis not present

## 2022-05-20 DIAGNOSIS — E039 Hypothyroidism, unspecified: Secondary | ICD-10-CM | POA: Diagnosis not present

## 2022-05-20 DIAGNOSIS — I129 Hypertensive chronic kidney disease with stage 1 through stage 4 chronic kidney disease, or unspecified chronic kidney disease: Secondary | ICD-10-CM | POA: Diagnosis not present

## 2022-05-22 ENCOUNTER — Other Ambulatory Visit: Payer: Self-pay | Admitting: Family Medicine

## 2022-05-22 DIAGNOSIS — R350 Frequency of micturition: Secondary | ICD-10-CM | POA: Diagnosis not present

## 2022-05-22 DIAGNOSIS — K760 Fatty (change of) liver, not elsewhere classified: Secondary | ICD-10-CM

## 2022-05-30 DIAGNOSIS — J029 Acute pharyngitis, unspecified: Secondary | ICD-10-CM | POA: Diagnosis not present

## 2022-05-30 DIAGNOSIS — R6889 Other general symptoms and signs: Secondary | ICD-10-CM | POA: Diagnosis not present

## 2022-05-30 DIAGNOSIS — J069 Acute upper respiratory infection, unspecified: Secondary | ICD-10-CM | POA: Diagnosis not present

## 2022-05-30 DIAGNOSIS — Z1152 Encounter for screening for COVID-19: Secondary | ICD-10-CM | POA: Diagnosis not present

## 2022-06-08 ENCOUNTER — Encounter (HOSPITAL_COMMUNITY): Payer: Self-pay

## 2022-06-08 ENCOUNTER — Ambulatory Visit (HOSPITAL_COMMUNITY): Payer: BC Managed Care – PPO

## 2022-08-24 DIAGNOSIS — N182 Chronic kidney disease, stage 2 (mild): Secondary | ICD-10-CM | POA: Diagnosis not present

## 2022-08-24 DIAGNOSIS — E1122 Type 2 diabetes mellitus with diabetic chronic kidney disease: Secondary | ICD-10-CM | POA: Diagnosis not present

## 2022-08-24 DIAGNOSIS — E669 Obesity, unspecified: Secondary | ICD-10-CM | POA: Diagnosis not present

## 2022-08-24 DIAGNOSIS — E042 Nontoxic multinodular goiter: Secondary | ICD-10-CM | POA: Diagnosis not present

## 2022-09-07 DIAGNOSIS — N182 Chronic kidney disease, stage 2 (mild): Secondary | ICD-10-CM | POA: Diagnosis not present

## 2022-09-07 DIAGNOSIS — E042 Nontoxic multinodular goiter: Secondary | ICD-10-CM | POA: Diagnosis not present

## 2022-09-07 DIAGNOSIS — E1122 Type 2 diabetes mellitus with diabetic chronic kidney disease: Secondary | ICD-10-CM | POA: Diagnosis not present

## 2022-09-07 DIAGNOSIS — E669 Obesity, unspecified: Secondary | ICD-10-CM | POA: Diagnosis not present

## 2022-11-06 DIAGNOSIS — E042 Nontoxic multinodular goiter: Secondary | ICD-10-CM | POA: Diagnosis not present

## 2022-11-06 DIAGNOSIS — E669 Obesity, unspecified: Secondary | ICD-10-CM | POA: Diagnosis not present

## 2022-11-06 DIAGNOSIS — N182 Chronic kidney disease, stage 2 (mild): Secondary | ICD-10-CM | POA: Diagnosis not present

## 2022-11-06 DIAGNOSIS — E1122 Type 2 diabetes mellitus with diabetic chronic kidney disease: Secondary | ICD-10-CM | POA: Diagnosis not present

## 2022-11-13 ENCOUNTER — Other Ambulatory Visit: Payer: Self-pay | Admitting: Internal Medicine

## 2022-11-13 DIAGNOSIS — E042 Nontoxic multinodular goiter: Secondary | ICD-10-CM

## 2022-11-17 ENCOUNTER — Ambulatory Visit
Admission: RE | Admit: 2022-11-17 | Discharge: 2022-11-17 | Disposition: A | Discharge status: Home / Self Care | Payer: BC Managed Care – PPO | Source: Ambulatory Visit | Attending: Internal Medicine | Admitting: Internal Medicine

## 2022-11-17 DIAGNOSIS — E042 Nontoxic multinodular goiter: Secondary | ICD-10-CM

## 2022-11-17 DIAGNOSIS — E041 Nontoxic single thyroid nodule: Secondary | ICD-10-CM | POA: Diagnosis not present

## 2022-11-20 DIAGNOSIS — E039 Hypothyroidism, unspecified: Secondary | ICD-10-CM | POA: Diagnosis not present

## 2022-11-20 DIAGNOSIS — E1165 Type 2 diabetes mellitus with hyperglycemia: Secondary | ICD-10-CM | POA: Diagnosis not present

## 2022-11-20 DIAGNOSIS — N182 Chronic kidney disease, stage 2 (mild): Secondary | ICD-10-CM | POA: Diagnosis not present

## 2022-11-20 DIAGNOSIS — R350 Frequency of micturition: Secondary | ICD-10-CM | POA: Diagnosis not present

## 2022-11-20 DIAGNOSIS — G2581 Restless legs syndrome: Secondary | ICD-10-CM | POA: Diagnosis not present

## 2022-11-20 DIAGNOSIS — E1122 Type 2 diabetes mellitus with diabetic chronic kidney disease: Secondary | ICD-10-CM | POA: Diagnosis not present

## 2022-11-20 DIAGNOSIS — E785 Hyperlipidemia, unspecified: Secondary | ICD-10-CM | POA: Diagnosis not present

## 2022-11-20 DIAGNOSIS — I129 Hypertensive chronic kidney disease with stage 1 through stage 4 chronic kidney disease, or unspecified chronic kidney disease: Secondary | ICD-10-CM | POA: Diagnosis not present

## 2022-11-20 DIAGNOSIS — Z0189 Encounter for other specified special examinations: Secondary | ICD-10-CM | POA: Diagnosis not present

## 2022-11-20 DIAGNOSIS — E669 Obesity, unspecified: Secondary | ICD-10-CM | POA: Diagnosis not present

## 2022-11-23 ENCOUNTER — Other Ambulatory Visit: Payer: Self-pay | Admitting: Family Medicine

## 2022-11-23 DIAGNOSIS — R7989 Other specified abnormal findings of blood chemistry: Secondary | ICD-10-CM

## 2022-11-30 DIAGNOSIS — L400 Psoriasis vulgaris: Secondary | ICD-10-CM | POA: Diagnosis not present

## 2022-11-30 DIAGNOSIS — L728 Other follicular cysts of the skin and subcutaneous tissue: Secondary | ICD-10-CM | POA: Diagnosis not present

## 2022-12-10 ENCOUNTER — Ambulatory Visit
Admission: RE | Admit: 2022-12-10 | Discharge: 2022-12-10 | Disposition: A | Discharge status: Home / Self Care | Payer: BC Managed Care – PPO | Source: Ambulatory Visit | Attending: Family Medicine | Admitting: Family Medicine

## 2022-12-10 DIAGNOSIS — R945 Abnormal results of liver function studies: Secondary | ICD-10-CM | POA: Diagnosis not present

## 2022-12-10 DIAGNOSIS — R7989 Other specified abnormal findings of blood chemistry: Secondary | ICD-10-CM

## 2022-12-14 DIAGNOSIS — R944 Abnormal results of kidney function studies: Secondary | ICD-10-CM | POA: Diagnosis not present

## 2022-12-14 DIAGNOSIS — N289 Disorder of kidney and ureter, unspecified: Secondary | ICD-10-CM | POA: Diagnosis not present

## 2022-12-18 DIAGNOSIS — E119 Type 2 diabetes mellitus without complications: Secondary | ICD-10-CM | POA: Diagnosis not present

## 2023-02-16 DIAGNOSIS — E559 Vitamin D deficiency, unspecified: Secondary | ICD-10-CM | POA: Diagnosis not present

## 2023-02-16 DIAGNOSIS — E1122 Type 2 diabetes mellitus with diabetic chronic kidney disease: Secondary | ICD-10-CM | POA: Diagnosis not present

## 2023-02-16 DIAGNOSIS — N182 Chronic kidney disease, stage 2 (mild): Secondary | ICD-10-CM | POA: Diagnosis not present

## 2023-02-16 DIAGNOSIS — R809 Proteinuria, unspecified: Secondary | ICD-10-CM | POA: Diagnosis not present

## 2023-02-16 DIAGNOSIS — E1129 Type 2 diabetes mellitus with other diabetic kidney complication: Secondary | ICD-10-CM | POA: Diagnosis not present

## 2023-02-16 DIAGNOSIS — I129 Hypertensive chronic kidney disease with stage 1 through stage 4 chronic kidney disease, or unspecified chronic kidney disease: Secondary | ICD-10-CM | POA: Diagnosis not present

## 2023-03-03 DIAGNOSIS — L400 Psoriasis vulgaris: Secondary | ICD-10-CM | POA: Diagnosis not present

## 2023-03-04 DIAGNOSIS — R809 Proteinuria, unspecified: Secondary | ICD-10-CM | POA: Diagnosis not present

## 2023-03-12 DIAGNOSIS — K7581 Nonalcoholic steatohepatitis (NASH): Secondary | ICD-10-CM | POA: Diagnosis not present

## 2023-03-12 DIAGNOSIS — R748 Abnormal levels of other serum enzymes: Secondary | ICD-10-CM | POA: Diagnosis not present

## 2023-06-15 DIAGNOSIS — N182 Chronic kidney disease, stage 2 (mild): Secondary | ICD-10-CM | POA: Diagnosis not present

## 2023-07-21 ENCOUNTER — Other Ambulatory Visit: Payer: Self-pay | Admitting: Nurse Practitioner

## 2023-07-21 DIAGNOSIS — R748 Abnormal levels of other serum enzymes: Secondary | ICD-10-CM | POA: Diagnosis not present

## 2023-07-21 DIAGNOSIS — K7581 Nonalcoholic steatohepatitis (NASH): Secondary | ICD-10-CM

## 2023-07-21 DIAGNOSIS — K7402 Hepatic fibrosis, advanced fibrosis: Secondary | ICD-10-CM

## 2023-07-29 ENCOUNTER — Other Ambulatory Visit

## 2023-08-16 ENCOUNTER — Ambulatory Visit
Admission: RE | Admit: 2023-08-16 | Discharge: 2023-08-16 | Disposition: A | Discharge status: Home / Self Care | Source: Ambulatory Visit | Attending: Nurse Practitioner | Admitting: Nurse Practitioner

## 2023-08-16 DIAGNOSIS — K760 Fatty (change of) liver, not elsewhere classified: Secondary | ICD-10-CM | POA: Diagnosis not present

## 2023-08-16 DIAGNOSIS — K7581 Nonalcoholic steatohepatitis (NASH): Secondary | ICD-10-CM

## 2023-08-16 DIAGNOSIS — K7402 Hepatic fibrosis, advanced fibrosis: Secondary | ICD-10-CM

## 2023-08-16 DIAGNOSIS — K828 Other specified diseases of gallbladder: Secondary | ICD-10-CM | POA: Diagnosis not present

## 2023-08-16 DIAGNOSIS — R161 Splenomegaly, not elsewhere classified: Secondary | ICD-10-CM | POA: Diagnosis not present

## 2023-09-07 DIAGNOSIS — E042 Nontoxic multinodular goiter: Secondary | ICD-10-CM | POA: Diagnosis not present

## 2023-09-07 DIAGNOSIS — N182 Chronic kidney disease, stage 2 (mild): Secondary | ICD-10-CM | POA: Diagnosis not present

## 2023-09-07 DIAGNOSIS — E1165 Type 2 diabetes mellitus with hyperglycemia: Secondary | ICD-10-CM | POA: Diagnosis not present

## 2023-09-07 DIAGNOSIS — E1122 Type 2 diabetes mellitus with diabetic chronic kidney disease: Secondary | ICD-10-CM | POA: Diagnosis not present

## 2023-09-07 DIAGNOSIS — E669 Obesity, unspecified: Secondary | ICD-10-CM | POA: Diagnosis not present

## 2023-09-13 DIAGNOSIS — N182 Chronic kidney disease, stage 2 (mild): Secondary | ICD-10-CM | POA: Diagnosis not present

## 2023-09-13 DIAGNOSIS — I129 Hypertensive chronic kidney disease with stage 1 through stage 4 chronic kidney disease, or unspecified chronic kidney disease: Secondary | ICD-10-CM | POA: Diagnosis not present

## 2023-09-13 DIAGNOSIS — R809 Proteinuria, unspecified: Secondary | ICD-10-CM | POA: Diagnosis not present

## 2023-09-13 DIAGNOSIS — E1122 Type 2 diabetes mellitus with diabetic chronic kidney disease: Secondary | ICD-10-CM | POA: Diagnosis not present

## 2023-10-07 DIAGNOSIS — E042 Nontoxic multinodular goiter: Secondary | ICD-10-CM | POA: Diagnosis not present

## 2023-10-07 DIAGNOSIS — N182 Chronic kidney disease, stage 2 (mild): Secondary | ICD-10-CM | POA: Diagnosis not present

## 2023-10-07 DIAGNOSIS — E1122 Type 2 diabetes mellitus with diabetic chronic kidney disease: Secondary | ICD-10-CM | POA: Diagnosis not present

## 2023-10-07 DIAGNOSIS — E1136 Type 2 diabetes mellitus with diabetic cataract: Secondary | ICD-10-CM | POA: Diagnosis not present

## 2023-10-07 DIAGNOSIS — E1165 Type 2 diabetes mellitus with hyperglycemia: Secondary | ICD-10-CM | POA: Diagnosis not present

## 2023-10-07 DIAGNOSIS — E669 Obesity, unspecified: Secondary | ICD-10-CM | POA: Diagnosis not present

## 2023-10-13 DIAGNOSIS — H43812 Vitreous degeneration, left eye: Secondary | ICD-10-CM | POA: Diagnosis not present

## 2023-10-13 DIAGNOSIS — H25813 Combined forms of age-related cataract, bilateral: Secondary | ICD-10-CM | POA: Diagnosis not present

## 2023-10-13 DIAGNOSIS — E119 Type 2 diabetes mellitus without complications: Secondary | ICD-10-CM | POA: Diagnosis not present

## 2023-10-13 DIAGNOSIS — H401132 Primary open-angle glaucoma, bilateral, moderate stage: Secondary | ICD-10-CM | POA: Diagnosis not present

## 2023-10-28 DIAGNOSIS — H401122 Primary open-angle glaucoma, left eye, moderate stage: Secondary | ICD-10-CM | POA: Diagnosis not present

## 2023-10-28 DIAGNOSIS — H2512 Age-related nuclear cataract, left eye: Secondary | ICD-10-CM | POA: Diagnosis not present

## 2023-11-05 DIAGNOSIS — N182 Chronic kidney disease, stage 2 (mild): Secondary | ICD-10-CM | POA: Diagnosis not present

## 2023-11-05 DIAGNOSIS — E1122 Type 2 diabetes mellitus with diabetic chronic kidney disease: Secondary | ICD-10-CM | POA: Diagnosis not present

## 2023-11-05 DIAGNOSIS — E119 Type 2 diabetes mellitus without complications: Secondary | ICD-10-CM | POA: Diagnosis not present

## 2023-11-05 DIAGNOSIS — I129 Hypertensive chronic kidney disease with stage 1 through stage 4 chronic kidney disease, or unspecified chronic kidney disease: Secondary | ICD-10-CM | POA: Diagnosis not present

## 2023-11-15 DIAGNOSIS — H2511 Age-related nuclear cataract, right eye: Secondary | ICD-10-CM | POA: Diagnosis not present

## 2023-11-18 DIAGNOSIS — H2511 Age-related nuclear cataract, right eye: Secondary | ICD-10-CM | POA: Diagnosis not present

## 2023-11-18 DIAGNOSIS — H401112 Primary open-angle glaucoma, right eye, moderate stage: Secondary | ICD-10-CM | POA: Diagnosis not present

## 2023-12-07 DIAGNOSIS — E1122 Type 2 diabetes mellitus with diabetic chronic kidney disease: Secondary | ICD-10-CM | POA: Diagnosis not present

## 2023-12-07 DIAGNOSIS — E119 Type 2 diabetes mellitus without complications: Secondary | ICD-10-CM | POA: Diagnosis not present

## 2023-12-07 DIAGNOSIS — I129 Hypertensive chronic kidney disease with stage 1 through stage 4 chronic kidney disease, or unspecified chronic kidney disease: Secondary | ICD-10-CM | POA: Diagnosis not present

## 2023-12-07 DIAGNOSIS — N182 Chronic kidney disease, stage 2 (mild): Secondary | ICD-10-CM | POA: Diagnosis not present

## 2023-12-09 DIAGNOSIS — E669 Obesity, unspecified: Secondary | ICD-10-CM | POA: Diagnosis not present

## 2023-12-09 DIAGNOSIS — N182 Chronic kidney disease, stage 2 (mild): Secondary | ICD-10-CM | POA: Diagnosis not present

## 2023-12-09 DIAGNOSIS — H401132 Primary open-angle glaucoma, bilateral, moderate stage: Secondary | ICD-10-CM | POA: Diagnosis not present

## 2023-12-09 DIAGNOSIS — H409 Unspecified glaucoma: Secondary | ICD-10-CM | POA: Diagnosis not present

## 2023-12-09 DIAGNOSIS — E1122 Type 2 diabetes mellitus with diabetic chronic kidney disease: Secondary | ICD-10-CM | POA: Diagnosis not present

## 2023-12-09 DIAGNOSIS — I129 Hypertensive chronic kidney disease with stage 1 through stage 4 chronic kidney disease, or unspecified chronic kidney disease: Secondary | ICD-10-CM | POA: Diagnosis not present

## 2023-12-09 DIAGNOSIS — E785 Hyperlipidemia, unspecified: Secondary | ICD-10-CM | POA: Diagnosis not present

## 2023-12-09 DIAGNOSIS — E119 Type 2 diabetes mellitus without complications: Secondary | ICD-10-CM | POA: Diagnosis not present

## 2023-12-30 DIAGNOSIS — E1165 Type 2 diabetes mellitus with hyperglycemia: Secondary | ICD-10-CM | POA: Diagnosis not present

## 2023-12-30 DIAGNOSIS — E1122 Type 2 diabetes mellitus with diabetic chronic kidney disease: Secondary | ICD-10-CM | POA: Diagnosis not present

## 2023-12-30 DIAGNOSIS — K760 Fatty (change of) liver, not elsewhere classified: Secondary | ICD-10-CM | POA: Diagnosis not present

## 2023-12-30 DIAGNOSIS — E781 Pure hyperglyceridemia: Secondary | ICD-10-CM | POA: Diagnosis not present

## 2023-12-30 DIAGNOSIS — E039 Hypothyroidism, unspecified: Secondary | ICD-10-CM | POA: Diagnosis not present

## 2023-12-30 DIAGNOSIS — E559 Vitamin D deficiency, unspecified: Secondary | ICD-10-CM | POA: Diagnosis not present

## 2023-12-30 DIAGNOSIS — R809 Proteinuria, unspecified: Secondary | ICD-10-CM | POA: Diagnosis not present

## 2024-01-04 DIAGNOSIS — K760 Fatty (change of) liver, not elsewhere classified: Secondary | ICD-10-CM | POA: Diagnosis not present

## 2024-01-04 DIAGNOSIS — L409 Psoriasis, unspecified: Secondary | ICD-10-CM | POA: Diagnosis not present

## 2024-01-04 DIAGNOSIS — I129 Hypertensive chronic kidney disease with stage 1 through stage 4 chronic kidney disease, or unspecified chronic kidney disease: Secondary | ICD-10-CM | POA: Diagnosis not present

## 2024-01-04 DIAGNOSIS — E039 Hypothyroidism, unspecified: Secondary | ICD-10-CM | POA: Diagnosis not present

## 2024-01-04 DIAGNOSIS — N182 Chronic kidney disease, stage 2 (mild): Secondary | ICD-10-CM | POA: Diagnosis not present

## 2024-01-04 DIAGNOSIS — E1165 Type 2 diabetes mellitus with hyperglycemia: Secondary | ICD-10-CM | POA: Diagnosis not present

## 2024-01-04 DIAGNOSIS — E785 Hyperlipidemia, unspecified: Secondary | ICD-10-CM | POA: Diagnosis not present

## 2024-01-04 DIAGNOSIS — Z0000 Encounter for general adult medical examination without abnormal findings: Secondary | ICD-10-CM | POA: Diagnosis not present

## 2024-01-04 DIAGNOSIS — E1122 Type 2 diabetes mellitus with diabetic chronic kidney disease: Secondary | ICD-10-CM | POA: Diagnosis not present

## 2024-01-04 DIAGNOSIS — M654 Radial styloid tenosynovitis [de Quervain]: Secondary | ICD-10-CM | POA: Diagnosis not present

## 2024-01-04 DIAGNOSIS — R809 Proteinuria, unspecified: Secondary | ICD-10-CM | POA: Diagnosis not present

## 2024-01-04 DIAGNOSIS — E559 Vitamin D deficiency, unspecified: Secondary | ICD-10-CM | POA: Diagnosis not present

## 2024-01-04 DIAGNOSIS — G2581 Restless legs syndrome: Secondary | ICD-10-CM | POA: Diagnosis not present

## 2024-01-06 DIAGNOSIS — E119 Type 2 diabetes mellitus without complications: Secondary | ICD-10-CM | POA: Diagnosis not present

## 2024-01-06 DIAGNOSIS — N182 Chronic kidney disease, stage 2 (mild): Secondary | ICD-10-CM | POA: Diagnosis not present

## 2024-01-06 DIAGNOSIS — I129 Hypertensive chronic kidney disease with stage 1 through stage 4 chronic kidney disease, or unspecified chronic kidney disease: Secondary | ICD-10-CM | POA: Diagnosis not present

## 2024-01-06 DIAGNOSIS — E1122 Type 2 diabetes mellitus with diabetic chronic kidney disease: Secondary | ICD-10-CM | POA: Diagnosis not present

## 2024-01-09 DIAGNOSIS — E785 Hyperlipidemia, unspecified: Secondary | ICD-10-CM | POA: Diagnosis not present

## 2024-01-09 DIAGNOSIS — H401132 Primary open-angle glaucoma, bilateral, moderate stage: Secondary | ICD-10-CM | POA: Diagnosis not present

## 2024-01-09 DIAGNOSIS — H409 Unspecified glaucoma: Secondary | ICD-10-CM | POA: Diagnosis not present

## 2024-01-09 DIAGNOSIS — E669 Obesity, unspecified: Secondary | ICD-10-CM | POA: Diagnosis not present

## 2024-01-11 DIAGNOSIS — L400 Psoriasis vulgaris: Secondary | ICD-10-CM | POA: Diagnosis not present

## 2024-01-20 DIAGNOSIS — M654 Radial styloid tenosynovitis [de Quervain]: Secondary | ICD-10-CM | POA: Diagnosis not present

## 2024-01-25 DIAGNOSIS — E1136 Type 2 diabetes mellitus with diabetic cataract: Secondary | ICD-10-CM | POA: Diagnosis not present

## 2024-01-25 DIAGNOSIS — E042 Nontoxic multinodular goiter: Secondary | ICD-10-CM | POA: Diagnosis not present

## 2024-01-25 DIAGNOSIS — E1165 Type 2 diabetes mellitus with hyperglycemia: Secondary | ICD-10-CM | POA: Diagnosis not present

## 2024-01-25 DIAGNOSIS — N182 Chronic kidney disease, stage 2 (mild): Secondary | ICD-10-CM | POA: Diagnosis not present

## 2024-01-25 DIAGNOSIS — E669 Obesity, unspecified: Secondary | ICD-10-CM | POA: Diagnosis not present

## 2024-01-25 DIAGNOSIS — E1122 Type 2 diabetes mellitus with diabetic chronic kidney disease: Secondary | ICD-10-CM | POA: Diagnosis not present

## 2024-01-26 ENCOUNTER — Other Ambulatory Visit: Payer: Self-pay | Admitting: Nurse Practitioner

## 2024-01-26 DIAGNOSIS — K7581 Nonalcoholic steatohepatitis (NASH): Secondary | ICD-10-CM

## 2024-01-26 DIAGNOSIS — K7402 Hepatic fibrosis, advanced fibrosis: Secondary | ICD-10-CM | POA: Diagnosis not present

## 2024-01-26 DIAGNOSIS — I952 Hypotension due to drugs: Secondary | ICD-10-CM | POA: Diagnosis not present

## 2024-02-01 DIAGNOSIS — R208 Other disturbances of skin sensation: Secondary | ICD-10-CM | POA: Diagnosis not present

## 2024-02-01 DIAGNOSIS — L720 Epidermal cyst: Secondary | ICD-10-CM | POA: Diagnosis not present

## 2024-02-01 DIAGNOSIS — L538 Other specified erythematous conditions: Secondary | ICD-10-CM | POA: Diagnosis not present

## 2024-02-01 DIAGNOSIS — L728 Other follicular cysts of the skin and subcutaneous tissue: Secondary | ICD-10-CM | POA: Diagnosis not present

## 2024-02-05 DIAGNOSIS — I129 Hypertensive chronic kidney disease with stage 1 through stage 4 chronic kidney disease, or unspecified chronic kidney disease: Secondary | ICD-10-CM | POA: Diagnosis not present

## 2024-02-05 DIAGNOSIS — E1122 Type 2 diabetes mellitus with diabetic chronic kidney disease: Secondary | ICD-10-CM | POA: Diagnosis not present

## 2024-02-05 DIAGNOSIS — N182 Chronic kidney disease, stage 2 (mild): Secondary | ICD-10-CM | POA: Diagnosis not present

## 2024-02-05 DIAGNOSIS — E119 Type 2 diabetes mellitus without complications: Secondary | ICD-10-CM | POA: Diagnosis not present

## 2024-02-08 DIAGNOSIS — E669 Obesity, unspecified: Secondary | ICD-10-CM | POA: Diagnosis not present

## 2024-02-08 DIAGNOSIS — N182 Chronic kidney disease, stage 2 (mild): Secondary | ICD-10-CM | POA: Diagnosis not present

## 2024-02-08 DIAGNOSIS — E1122 Type 2 diabetes mellitus with diabetic chronic kidney disease: Secondary | ICD-10-CM | POA: Diagnosis not present

## 2024-02-08 DIAGNOSIS — E785 Hyperlipidemia, unspecified: Secondary | ICD-10-CM | POA: Diagnosis not present

## 2024-02-08 DIAGNOSIS — H401132 Primary open-angle glaucoma, bilateral, moderate stage: Secondary | ICD-10-CM | POA: Diagnosis not present

## 2024-02-08 DIAGNOSIS — H409 Unspecified glaucoma: Secondary | ICD-10-CM | POA: Diagnosis not present

## 2024-02-08 DIAGNOSIS — I129 Hypertensive chronic kidney disease with stage 1 through stage 4 chronic kidney disease, or unspecified chronic kidney disease: Secondary | ICD-10-CM | POA: Diagnosis not present

## 2024-02-08 DIAGNOSIS — E119 Type 2 diabetes mellitus without complications: Secondary | ICD-10-CM | POA: Diagnosis not present

## 2024-02-10 ENCOUNTER — Ambulatory Visit
Admission: RE | Admit: 2024-02-10 | Discharge: 2024-02-10 | Disposition: A | Source: Ambulatory Visit | Attending: Nurse Practitioner

## 2024-02-10 DIAGNOSIS — K7402 Hepatic fibrosis, advanced fibrosis: Secondary | ICD-10-CM

## 2024-02-10 DIAGNOSIS — K760 Fatty (change of) liver, not elsewhere classified: Secondary | ICD-10-CM | POA: Diagnosis not present

## 2024-02-10 DIAGNOSIS — K7581 Nonalcoholic steatohepatitis (NASH): Secondary | ICD-10-CM

## 2024-02-10 DIAGNOSIS — K8020 Calculus of gallbladder without cholecystitis without obstruction: Secondary | ICD-10-CM | POA: Diagnosis not present

## 2024-02-17 DIAGNOSIS — L723 Sebaceous cyst: Secondary | ICD-10-CM | POA: Diagnosis not present

## 2024-02-17 DIAGNOSIS — L728 Other follicular cysts of the skin and subcutaneous tissue: Secondary | ICD-10-CM | POA: Diagnosis not present

## 2024-02-17 DIAGNOSIS — L720 Epidermal cyst: Secondary | ICD-10-CM | POA: Diagnosis not present

## 2024-02-17 DIAGNOSIS — R208 Other disturbances of skin sensation: Secondary | ICD-10-CM | POA: Diagnosis not present

## 2024-02-21 DIAGNOSIS — R2232 Localized swelling, mass and lump, left upper limb: Secondary | ICD-10-CM | POA: Diagnosis not present

## 2024-02-21 DIAGNOSIS — M654 Radial styloid tenosynovitis [de Quervain]: Secondary | ICD-10-CM | POA: Diagnosis not present

## 2024-02-22 ENCOUNTER — Other Ambulatory Visit: Payer: Self-pay | Admitting: Nurse Practitioner

## 2024-02-22 DIAGNOSIS — D376 Neoplasm of uncertain behavior of liver, gallbladder and bile ducts: Secondary | ICD-10-CM

## 2024-03-01 ENCOUNTER — Ambulatory Visit
Admission: RE | Admit: 2024-03-01 | Discharge: 2024-03-01 | Disposition: A | Discharge status: Home / Self Care | Source: Ambulatory Visit | Attending: Nurse Practitioner | Admitting: Nurse Practitioner

## 2024-03-01 DIAGNOSIS — D376 Neoplasm of uncertain behavior of liver, gallbladder and bile ducts: Secondary | ICD-10-CM

## 2024-03-01 DIAGNOSIS — K7460 Unspecified cirrhosis of liver: Secondary | ICD-10-CM | POA: Diagnosis not present

## 2024-03-01 MED ORDER — IOPAMIDOL (ISOVUE-370) INJECTION 76%
70.0000 mL | Freq: Once | INTRAVENOUS | Status: AC | PRN
  Administered 2024-03-01: 70 mL via INTRAVENOUS

## 2024-03-06 DIAGNOSIS — E1122 Type 2 diabetes mellitus with diabetic chronic kidney disease: Secondary | ICD-10-CM | POA: Diagnosis not present

## 2024-03-06 DIAGNOSIS — E119 Type 2 diabetes mellitus without complications: Secondary | ICD-10-CM | POA: Diagnosis not present

## 2024-03-06 DIAGNOSIS — N182 Chronic kidney disease, stage 2 (mild): Secondary | ICD-10-CM | POA: Diagnosis not present

## 2024-03-06 DIAGNOSIS — I129 Hypertensive chronic kidney disease with stage 1 through stage 4 chronic kidney disease, or unspecified chronic kidney disease: Secondary | ICD-10-CM | POA: Diagnosis not present

## 2024-03-08 DIAGNOSIS — E781 Pure hyperglyceridemia: Secondary | ICD-10-CM | POA: Diagnosis not present

## 2024-03-10 DIAGNOSIS — I129 Hypertensive chronic kidney disease with stage 1 through stage 4 chronic kidney disease, or unspecified chronic kidney disease: Secondary | ICD-10-CM | POA: Diagnosis not present

## 2024-03-10 DIAGNOSIS — E119 Type 2 diabetes mellitus without complications: Secondary | ICD-10-CM | POA: Diagnosis not present

## 2024-03-10 DIAGNOSIS — H401132 Primary open-angle glaucoma, bilateral, moderate stage: Secondary | ICD-10-CM | POA: Diagnosis not present

## 2024-03-10 DIAGNOSIS — E1122 Type 2 diabetes mellitus with diabetic chronic kidney disease: Secondary | ICD-10-CM | POA: Diagnosis not present

## 2024-03-10 DIAGNOSIS — E785 Hyperlipidemia, unspecified: Secondary | ICD-10-CM | POA: Diagnosis not present

## 2024-03-10 DIAGNOSIS — E669 Obesity, unspecified: Secondary | ICD-10-CM | POA: Diagnosis not present

## 2024-03-10 DIAGNOSIS — H409 Unspecified glaucoma: Secondary | ICD-10-CM | POA: Diagnosis not present

## 2024-03-10 DIAGNOSIS — N182 Chronic kidney disease, stage 2 (mild): Secondary | ICD-10-CM | POA: Diagnosis not present

## 2024-03-14 DIAGNOSIS — R208 Other disturbances of skin sensation: Secondary | ICD-10-CM | POA: Diagnosis not present

## 2024-03-14 DIAGNOSIS — L538 Other specified erythematous conditions: Secondary | ICD-10-CM | POA: Diagnosis not present

## 2024-03-14 DIAGNOSIS — L728 Other follicular cysts of the skin and subcutaneous tissue: Secondary | ICD-10-CM | POA: Diagnosis not present

## 2024-03-14 DIAGNOSIS — L720 Epidermal cyst: Secondary | ICD-10-CM | POA: Diagnosis not present

## 2024-03-16 DIAGNOSIS — N182 Chronic kidney disease, stage 2 (mild): Secondary | ICD-10-CM | POA: Diagnosis not present

## 2024-03-16 DIAGNOSIS — E1122 Type 2 diabetes mellitus with diabetic chronic kidney disease: Secondary | ICD-10-CM | POA: Diagnosis not present

## 2024-03-27 DIAGNOSIS — Z961 Presence of intraocular lens: Secondary | ICD-10-CM | POA: Diagnosis not present

## 2024-03-27 DIAGNOSIS — H401132 Primary open-angle glaucoma, bilateral, moderate stage: Secondary | ICD-10-CM | POA: Diagnosis not present

## 2024-03-28 DIAGNOSIS — E1136 Type 2 diabetes mellitus with diabetic cataract: Secondary | ICD-10-CM | POA: Diagnosis not present

## 2024-03-28 DIAGNOSIS — E1122 Type 2 diabetes mellitus with diabetic chronic kidney disease: Secondary | ICD-10-CM | POA: Diagnosis not present

## 2024-03-28 DIAGNOSIS — I10 Essential (primary) hypertension: Secondary | ICD-10-CM | POA: Diagnosis not present

## 2024-03-28 DIAGNOSIS — E669 Obesity, unspecified: Secondary | ICD-10-CM | POA: Diagnosis not present

## 2024-03-28 DIAGNOSIS — N182 Chronic kidney disease, stage 2 (mild): Secondary | ICD-10-CM | POA: Diagnosis not present

## 2024-03-28 DIAGNOSIS — E042 Nontoxic multinodular goiter: Secondary | ICD-10-CM | POA: Diagnosis not present

## 2024-04-05 DIAGNOSIS — N182 Chronic kidney disease, stage 2 (mild): Secondary | ICD-10-CM | POA: Diagnosis not present

## 2024-04-05 DIAGNOSIS — E1122 Type 2 diabetes mellitus with diabetic chronic kidney disease: Secondary | ICD-10-CM | POA: Diagnosis not present

## 2024-04-05 DIAGNOSIS — I129 Hypertensive chronic kidney disease with stage 1 through stage 4 chronic kidney disease, or unspecified chronic kidney disease: Secondary | ICD-10-CM | POA: Diagnosis not present

## 2024-04-05 DIAGNOSIS — E119 Type 2 diabetes mellitus without complications: Secondary | ICD-10-CM | POA: Diagnosis not present

## 2024-04-09 DIAGNOSIS — H409 Unspecified glaucoma: Secondary | ICD-10-CM | POA: Diagnosis not present

## 2024-04-09 DIAGNOSIS — E119 Type 2 diabetes mellitus without complications: Secondary | ICD-10-CM | POA: Diagnosis not present

## 2024-04-09 DIAGNOSIS — H401132 Primary open-angle glaucoma, bilateral, moderate stage: Secondary | ICD-10-CM | POA: Diagnosis not present

## 2024-04-09 DIAGNOSIS — E1122 Type 2 diabetes mellitus with diabetic chronic kidney disease: Secondary | ICD-10-CM | POA: Diagnosis not present

## 2024-04-09 DIAGNOSIS — E669 Obesity, unspecified: Secondary | ICD-10-CM | POA: Diagnosis not present

## 2024-04-09 DIAGNOSIS — I129 Hypertensive chronic kidney disease with stage 1 through stage 4 chronic kidney disease, or unspecified chronic kidney disease: Secondary | ICD-10-CM | POA: Diagnosis not present

## 2024-04-09 DIAGNOSIS — N182 Chronic kidney disease, stage 2 (mild): Secondary | ICD-10-CM | POA: Diagnosis not present

## 2024-04-09 DIAGNOSIS — E785 Hyperlipidemia, unspecified: Secondary | ICD-10-CM | POA: Diagnosis not present
